# Patient Record
Sex: Female | Born: 1960 | Race: White | Hispanic: No | Marital: Single | Smoking: Never smoker
Health system: Southern US, Community
[De-identification: ages and names within clinical notes are randomized; demographics above are authoritative.]

## PROBLEM LIST (undated history)

## (undated) DIAGNOSIS — F32A Depression, unspecified: Secondary | ICD-10-CM

## (undated) DIAGNOSIS — Z9889 Other specified postprocedural states: Secondary | ICD-10-CM

## (undated) DIAGNOSIS — B019 Varicella without complication: Secondary | ICD-10-CM

## (undated) DIAGNOSIS — S8992XA Unspecified injury of left lower leg, initial encounter: Secondary | ICD-10-CM

## (undated) DIAGNOSIS — J342 Deviated nasal septum: Secondary | ICD-10-CM

## (undated) DIAGNOSIS — F439 Reaction to severe stress, unspecified: Secondary | ICD-10-CM

## (undated) DIAGNOSIS — B977 Papillomavirus as the cause of diseases classified elsewhere: Secondary | ICD-10-CM

## (undated) DIAGNOSIS — I209 Angina pectoris, unspecified: Secondary | ICD-10-CM

## (undated) DIAGNOSIS — F4321 Adjustment disorder with depressed mood: Secondary | ICD-10-CM

## (undated) DIAGNOSIS — F329 Major depressive disorder, single episode, unspecified: Secondary | ICD-10-CM

## (undated) HISTORY — PX: NOSE SURGERY: SHX723

## (undated) HISTORY — DX: Major depressive disorder, single episode, unspecified: F32.9

## (undated) HISTORY — DX: Papillomavirus as the cause of diseases classified elsewhere: B97.7

## (undated) HISTORY — DX: Varicella without complication: B01.9

## (undated) HISTORY — DX: Reaction to severe stress, unspecified: F43.9

## (undated) HISTORY — DX: Depression, unspecified: F32.A

## (undated) HISTORY — PX: OTHER SURGICAL HISTORY: SHX169

## (undated) HISTORY — PX: KNEE ARTHROSCOPY: SUR90

## (undated) HISTORY — DX: Deviated nasal septum: J34.2

## (undated) HISTORY — DX: Unspecified injury of left lower leg, initial encounter: S89.92XA

---

## 1982-09-15 HISTORY — PX: OTHER SURGICAL HISTORY: SHX169

## 2012-04-02 ENCOUNTER — Other Ambulatory Visit (HOSPITAL_COMMUNITY)
Admission: RE | Admit: 2012-04-02 | Discharge: 2012-04-02 | Disposition: A | Discharge status: Home / Self Care | Payer: BC Managed Care – PPO | Source: Ambulatory Visit | Attending: Family Medicine | Admitting: Family Medicine

## 2012-04-02 ENCOUNTER — Encounter: Payer: Self-pay | Admitting: Family Medicine

## 2012-04-02 ENCOUNTER — Ambulatory Visit (INDEPENDENT_AMBULATORY_CARE_PROVIDER_SITE_OTHER): Payer: BC Managed Care – PPO | Admitting: Family Medicine

## 2012-04-02 VITALS — BP 122/80 | HR 74 | Temp 98.3°F | Ht 63.0 in | Wt 155.8 lb

## 2012-04-02 DIAGNOSIS — Z01419 Encounter for gynecological examination (general) (routine) without abnormal findings: Secondary | ICD-10-CM | POA: Insufficient documentation

## 2012-04-02 DIAGNOSIS — J342 Deviated nasal septum: Secondary | ICD-10-CM

## 2012-04-02 DIAGNOSIS — Z124 Encounter for screening for malignant neoplasm of cervix: Secondary | ICD-10-CM | POA: Insufficient documentation

## 2012-04-02 LAB — BASIC METABOLIC PANEL
Calcium: 9.2 mg/dL (ref 8.4–10.5)
Creatinine, Ser: 0.7 mg/dL (ref 0.4–1.2)

## 2012-04-02 LAB — CBC WITH DIFFERENTIAL/PLATELET
Basophils Absolute: 0 10*3/uL (ref 0.0–0.1)
Eosinophils Absolute: 0.2 10*3/uL (ref 0.0–0.7)
HCT: 38.6 % (ref 36.0–46.0)
Hemoglobin: 12.8 g/dL (ref 12.0–15.0)
Lymphocytes Relative: 24.3 % (ref 12.0–46.0)
Lymphs Abs: 1.9 10*3/uL (ref 0.7–4.0)
MCHC: 33.1 g/dL (ref 30.0–36.0)
Neutro Abs: 4.8 10*3/uL (ref 1.4–7.7)
Platelets: 280 10*3/uL (ref 150.0–400.0)
RDW: 13.5 % (ref 11.5–14.6)

## 2012-04-02 LAB — LIPID PANEL
Cholesterol: 162 mg/dL (ref 0–200)
HDL: 61 mg/dL (ref >39.00)
LDL Cholesterol: 81 mg/dL (ref 0–99)
Triglycerides: 102 mg/dL (ref 0.0–149.0)
VLDL: 20.4 mg/dL (ref 0.0–40.0)

## 2012-04-02 LAB — HEPATIC FUNCTION PANEL
Bilirubin, Direct: 0 mg/dL (ref 0.0–0.3)
Total Bilirubin: 0.3 mg/dL (ref 0.3–1.2)

## 2012-04-02 NOTE — Progress Notes (Signed)
  Subjective:    Patient ID: Heather Hatfield, female    DOB: 19-Dec-1960, 51 y.o.   MRN: [id number]  HPI New to establish.  Previous MD in [place].  Desires CPE.  Due for colonoscopy after Jan, due for mammo and pap.  Nasal congestion- sxs started in Feb.  Hx of deviated septum w/ assymetric nostrils.  Finds herself pulling on her face to try and open her nasal passageways.  Not currently on any allergy meds.   Review of Systems Patient reports no vision/hearing changes, adenopathy, fever, weight change,  persistant/recurrent hoarseness , swallowing issues, chest pain, palpitations, edema, persistant/recurrent cough, hemoptysis, dyspnea (rest/exertional/paroxysmal nocturnal), gastrointestinal bleeding (melena, rectal bleeding), abdominal pain, significant heartburn, bowel changes, GU symptoms (dysuria, hematuria, incontinence), Gyn symptoms (abnormal  bleeding, pain),  syncope, focal weakness, memory loss, numbness & tingling, skin/hair/nail changes, abnormal bruising or bleeding, anxiety, or depression.   Fear of flying- takes Ativan prn.    Objective:   Physical Exam  General Appearance:    Alert, cooperative, no distress, appears stated age  Head:    Normocephalic, without obvious abnormality, atraumatic  Eyes:    PERRL, conjunctiva/corneas clear, EOM's intact, fundi    benign, both eyes  Ears:    Normal TM's and external ear canals, both ears  Nose:   Nares normal, septum deviated to R, mucosa edema, no drainage or sinus tenderness  Throat:   Lips, mucosa, and tongue normal; teeth and gums normal  Neck:   Supple, symmetrical, trachea midline, no adenopathy;    Thyroid: no enlargement/tenderness/nodules  Back:     Symmetric, no curvature, ROM normal, no CVA tenderness  Lungs:     Clear to auscultation bilaterally, respirations unlabored  Chest Wall:    No tenderness or deformity   Heart:    Regular rate and rhythm, S1 and S2 normal, no murmur, rub   or gallop  Breast Exam:    No  tenderness, masses, or nipple abnormality  Abdomen:     Soft, non-tender, bowel sounds active all four quadrants,    no masses, no organomegaly  Genitalia:    External genitalia normal, cervix normal in appearance, no CMT, uterus in normal size and position, adnexa w/out mass or tenderness, mucosa pink and moist, no lesions or discharge present  Rectal:    Normal external appearance  Extremities:   Extremities normal, atraumatic, no cyanosis or edema  Pulses:   2+ and symmetric all extremities  Skin:   Skin color, texture, turgor normal, no rashes or lesions  Lymph nodes:   Cervical, supraclavicular, and axillary nodes normal  Neurologic:   CNII-XII intact, normal strength, sensation and reflexes    throughout          Assessment & Plan:

## 2012-04-02 NOTE — Patient Instructions (Addendum)
We'll notify you of your lab results and make any changes if needed You look great!  Keep up the good work! We'll call you with your mammo appt Call with any questions or concerns Think of [place] as your home base Welcome!  We're glad to have you!

## 2012-04-02 NOTE — Assessment & Plan Note (Signed)
Pap collected. 

## 2012-04-02 NOTE — Assessment & Plan Note (Signed)
Pt's PE WNL w/ exception of deviated nasal septum.  Refer to ENT for complete evaluation.  Check labs.  EKG done- see document for interpretation.  Refer for mammo.  Anticipatory guidance provided.

## 2012-04-05 ENCOUNTER — Encounter: Payer: Self-pay | Admitting: *Deleted

## 2012-04-07 ENCOUNTER — Encounter: Payer: Self-pay | Admitting: *Deleted

## 2012-04-07 LAB — VITAMIN D 1,25 DIHYDROXY: Vitamin D2 1, 25 (OH)2: <8 pg/mL

## 2012-04-08 ENCOUNTER — Encounter: Payer: Self-pay | Admitting: *Deleted

## 2012-04-09 ENCOUNTER — Ambulatory Visit (HOSPITAL_BASED_OUTPATIENT_CLINIC_OR_DEPARTMENT_OTHER)
Admission: RE | Admit: 2012-04-09 | Discharge: 2012-04-09 | Disposition: A | Discharge status: Home / Self Care | Payer: BC Managed Care – PPO | Source: Ambulatory Visit | Attending: Family Medicine | Admitting: Family Medicine

## 2012-04-09 DIAGNOSIS — Z01419 Encounter for gynecological examination (general) (routine) without abnormal findings: Secondary | ICD-10-CM

## 2012-04-09 DIAGNOSIS — Z1231 Encounter for screening mammogram for malignant neoplasm of breast: Secondary | ICD-10-CM | POA: Insufficient documentation

## 2012-04-09 IMAGING — MG MM DIGITAL SCREENING BILATERAL
4 series · 4 of 4 positions shown · non-contrast
Comparison: Previous exams

CLINICAL DATA: Screening.

DIGITAL SCREENING MAMMOGRAM WITH CAD

[R CC]
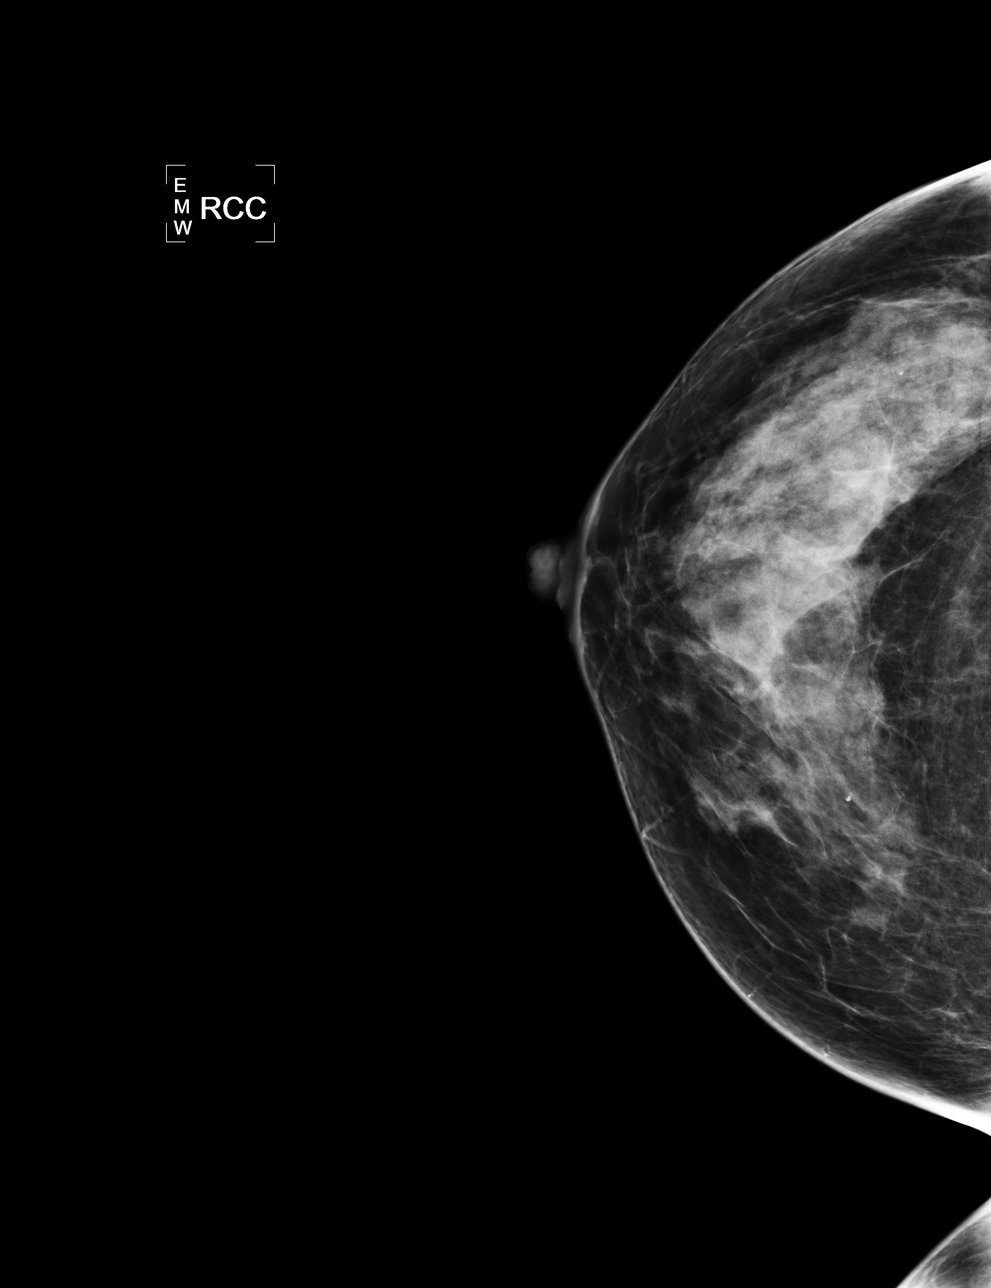

[L CC]
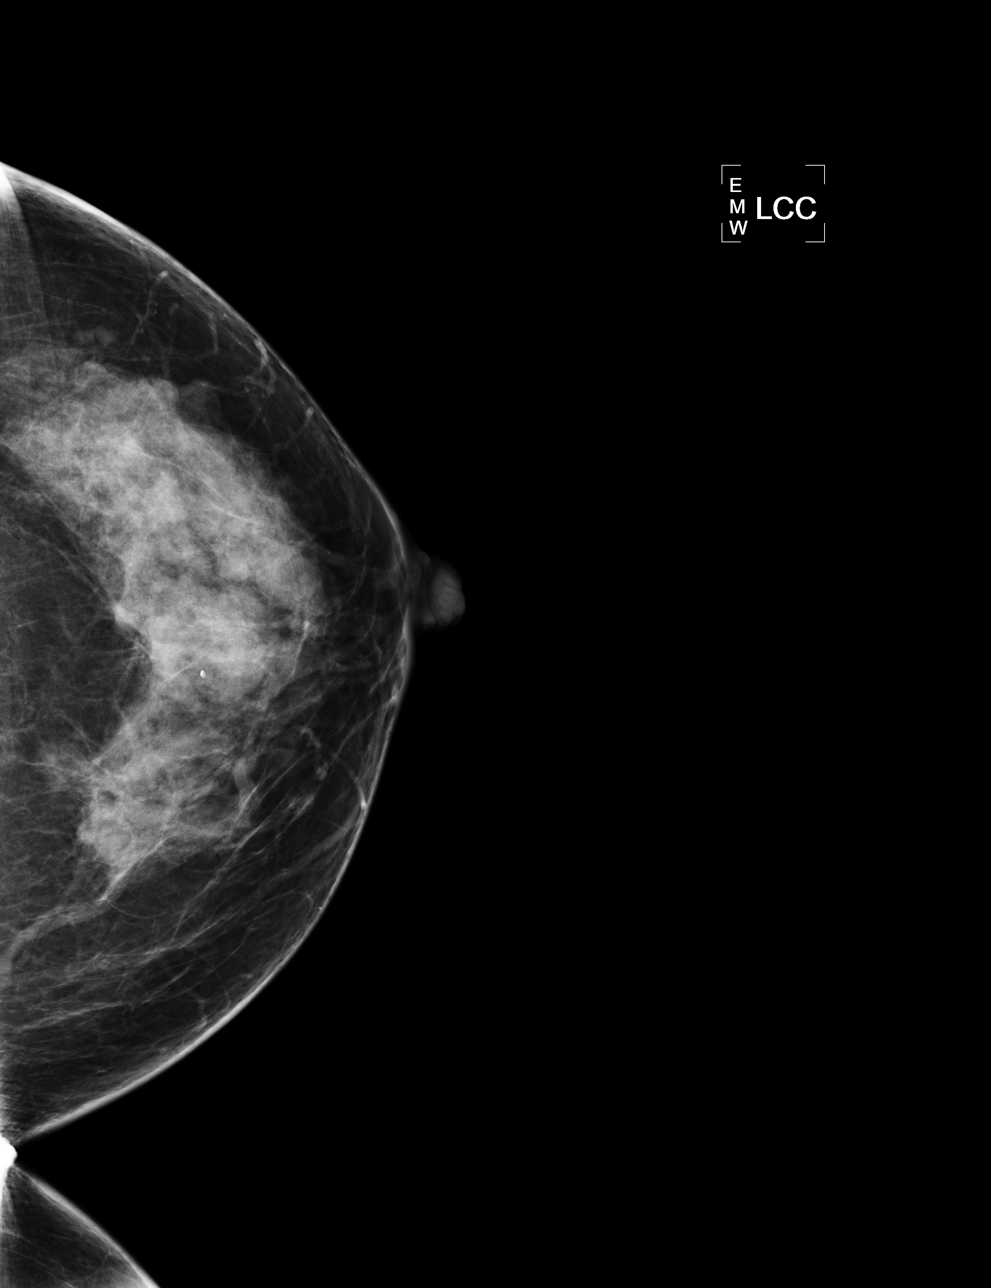

[L MLO]
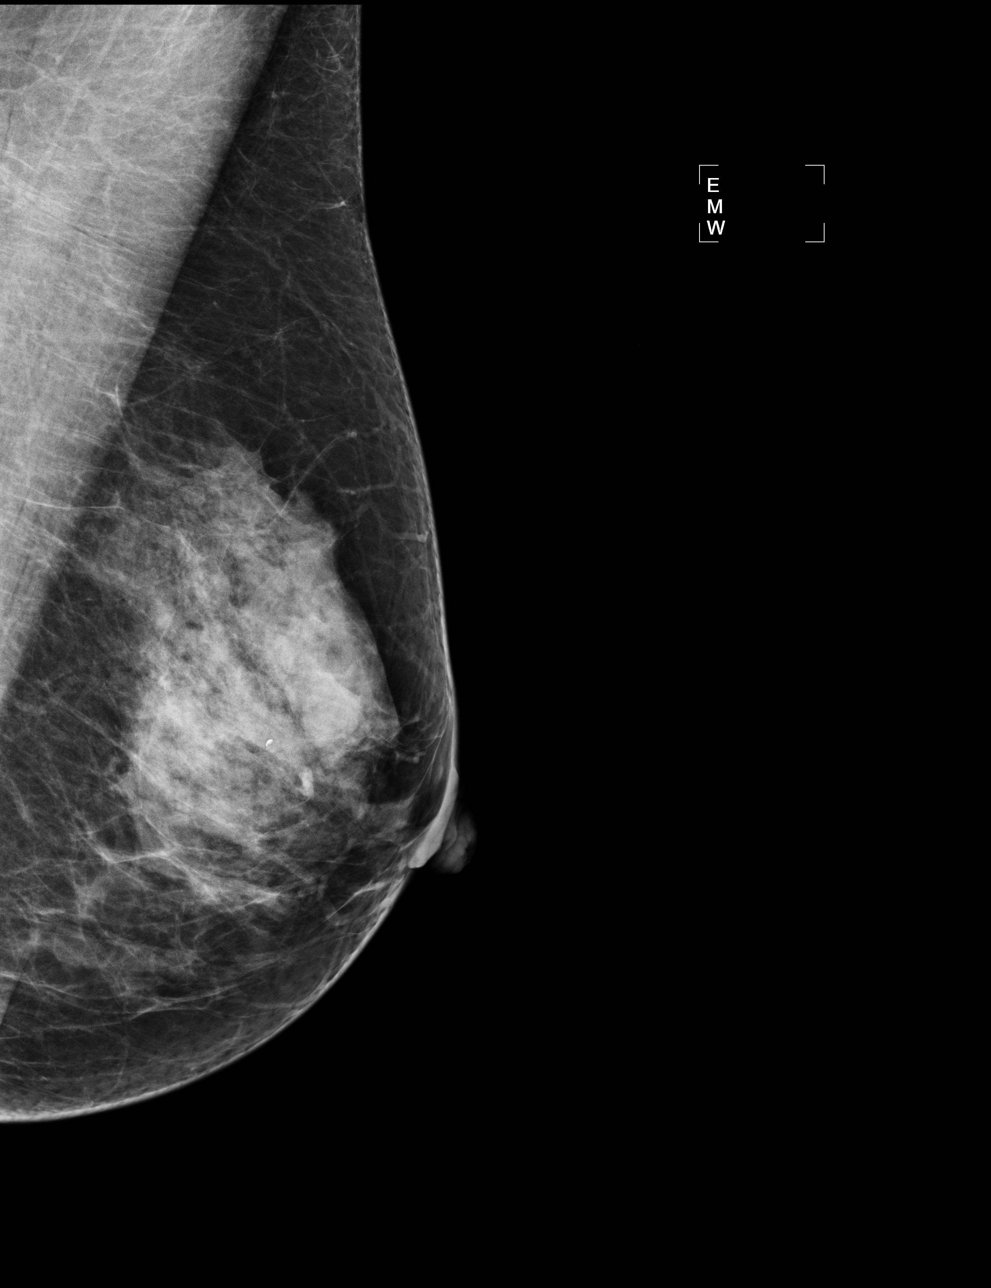

[R MLO]
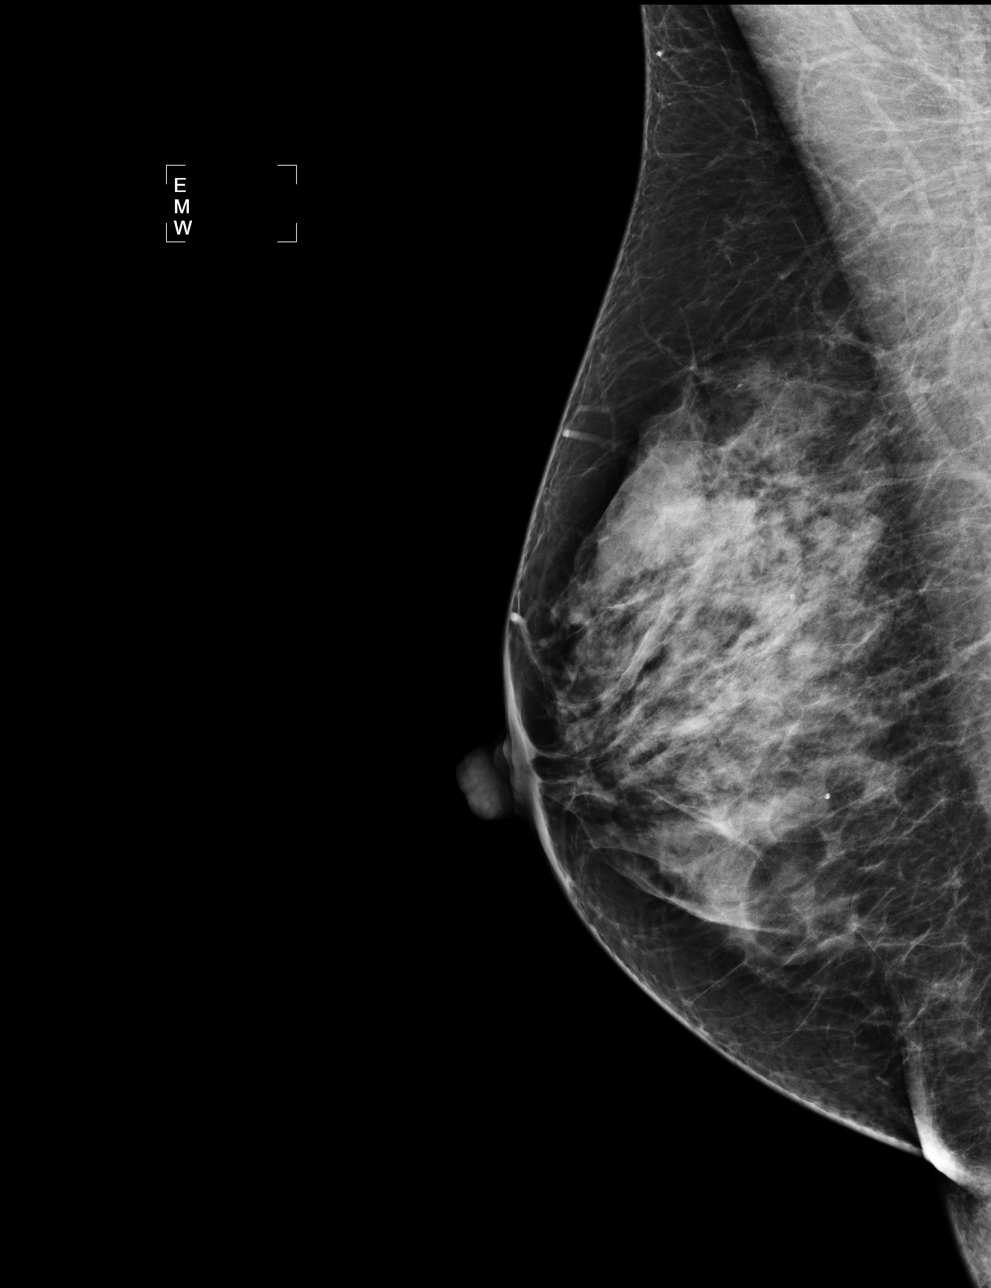

[4 of 4 positions shown; findings below may reference images not displayed]

FINDINGS: The breast tissue is heterogeneously dense. No new
masses, architectural distortion, or calcifications are present.

Images were processed with CAD.
IMPRESSION: No mammographic evidence of malignancy.

A result letter of this screening mammogram will be mailed directly
to the patient.

RECOMMENDATION:
Screening mammogram in one year. (Code:[94])

BI-RADS CATEGORY 1:  Negative

## 2012-08-10 ENCOUNTER — Ambulatory Visit (INDEPENDENT_AMBULATORY_CARE_PROVIDER_SITE_OTHER): Payer: BC Managed Care – PPO | Admitting: Internal Medicine

## 2012-08-10 ENCOUNTER — Encounter: Payer: Self-pay | Admitting: Internal Medicine

## 2012-08-10 VITALS — BP 136/84 | HR 79 | Temp 97.9°F | Resp 18 | Ht 63.0 in | Wt 156.0 lb

## 2012-08-10 DIAGNOSIS — F32A Depression, unspecified: Secondary | ICD-10-CM

## 2012-08-10 DIAGNOSIS — F329 Major depressive disorder, single episode, unspecified: Secondary | ICD-10-CM

## 2012-08-10 DIAGNOSIS — J342 Deviated nasal septum: Secondary | ICD-10-CM

## 2012-08-10 DIAGNOSIS — S99929A Unspecified injury of unspecified foot, initial encounter: Secondary | ICD-10-CM

## 2012-08-10 DIAGNOSIS — S8990XA Unspecified injury of unspecified lower leg, initial encounter: Secondary | ICD-10-CM

## 2012-08-10 DIAGNOSIS — F3289 Other specified depressive episodes: Secondary | ICD-10-CM

## 2012-08-10 DIAGNOSIS — S8992XA Unspecified injury of left lower leg, initial encounter: Secondary | ICD-10-CM

## 2012-08-10 NOTE — Patient Instructions (Addendum)
For skin surveillance exam  [facility]  [phone]  Dr. Emily Filbert Dr. Sharyn Lull  Dr. Danella Deis  BMI today  27.63  Blood pressure  136/84  Return as needed

## 2012-08-10 NOTE — Progress Notes (Signed)
  Subjective:    Patient ID: Heather Hatfield, female    DOB: 03/09/1961, 51 y.o.   MRN: [id number]  HPI  New pt here for first visit.  She works at [employer].   PMH of depression, deviated nasal septum and remote L knee injury that is managed at [facility].   Overall doing well but would like to speak to someone about weight loss and she needs a colonoscopy.  She has also reinjured her L knee but will be seeing [place] [facility] for this  She would also like to have a dermatologist for skin surveillance  Recent labs Dr. Beverely Low are normal  No Known Allergies Past Medical History  Diagnosis Date  . Depression     situational  . Chicken pox    Past Surgical History  Procedure Date  . Dnc 1984   History   Social History  . Marital Status: Single    Spouse Name: N/A    Number of Children: N/A  . Years of Education: N/A   Occupational History  . Not on file.   Social History Main Topics  . Smoking status: Never Smoker   . Smokeless tobacco: Not on file  . Alcohol Use: 0.0 oz/week     Comment: daily wine  . Drug Use: No  . Sexually Active: Yes    Birth Control/ Protection: Surgical   Other Topics Concern  . Not on file   Social History Narrative  . No narrative on file   Family History  Problem Relation Age of Onset  . Hyperlipidemia Mother   . Hyperlipidemia Father   . Stroke Father   . Hypertension Father   . Arthritis Maternal Grandmother   . Cancer Maternal Grandmother    Patient Active Problem List  Diagnosis  . Routine gynecological examination  . Screening for malignant neoplasm of the cervix   No current outpatient prescriptions on file prior to visit.     Review of Systems See HPI    Objective:   Physical Exam Physical Exam  Nursing note and vitals reviewed.  Constitutional: She is oriented to person, place, and time. She appears well-developed and well-nourished.  HENT:  Head: Normocephalic and atraumatic.  Cardiovascular: Normal rate and  regular rhythm. Exam reveals no gallop and no friction rub.  No murmur heard.  Pulmonary/Chest: Breath sounds normal. She has no wheezes. She has no rales.  Neurological: She is alert and oriented to person, place, and time.  Skin: Skin is warm and dry.  Psychiatric: She has a normal mood and affect. Her behavior is normal.              Assessment & Plan:  Overweight:  Will refer to Dr. Kinnie Scales for wt loss and screening colonoscopy  Depression on no meds now  History of deviated septum  L knee injury  Skin surveillance:  I gave pt number to Dr. Dellia Nims practice so she can call for appt

## 2012-12-06 ENCOUNTER — Encounter: Payer: Self-pay | Admitting: *Deleted

## 2013-07-14 ENCOUNTER — Ambulatory Visit (INDEPENDENT_AMBULATORY_CARE_PROVIDER_SITE_OTHER): Payer: BC Managed Care – PPO | Admitting: Internal Medicine

## 2013-07-14 ENCOUNTER — Encounter: Payer: Self-pay | Admitting: Internal Medicine

## 2013-07-14 VITALS — BP 119/73 | HR 70 | Temp 98.0°F | Resp 16 | Wt 151.0 lb

## 2013-07-14 DIAGNOSIS — F419 Anxiety disorder, unspecified: Secondary | ICD-10-CM

## 2013-07-14 DIAGNOSIS — Z733 Stress, not elsewhere classified: Secondary | ICD-10-CM

## 2013-07-14 DIAGNOSIS — F411 Generalized anxiety disorder: Secondary | ICD-10-CM

## 2013-07-14 DIAGNOSIS — F439 Reaction to severe stress, unspecified: Secondary | ICD-10-CM

## 2013-07-14 NOTE — Patient Instructions (Signed)
Quentin Mulling Cristela Felt:  [phone]  Vonna Kotyk  :  [phone]

## 2013-07-14 NOTE — Progress Notes (Signed)
  Subjective:    Patient ID: Heather Hatfield, female    DOB: [birth year], 52 y.o.   MRN: [id number]  HPI  Heather Hatfield is here for acute visit.    Lots of situational stress.  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  Long distance relationship  Inability to trust  As first husband left her after an affair  She currently gets very irritable and anxious.    Some sadness but not daily.  Had ativan and low dose prozac in past but does not want any meds at this point.  She would like names of a therapist.    No daily depression  No S/H ideation  No Known Allergies Past Medical History  Diagnosis Date  . Depression     situational  . Chicken pox    Past Surgical History  Procedure Laterality Date  . Dnc  1984  . Knee arthroscopy    . Nose surgery     History   Social History  . Marital Status: Single    Spouse Name: N/A    Number of Children: N/A  . Years of Education: N/A   Occupational History  . Not on file.   Social History Main Topics  . Smoking status: Never Smoker   . Smokeless tobacco: Not on file  . Alcohol Use: 0.0 oz/week     Comment: daily wine  . Drug Use: No  . Sexual Activity: Yes    Birth Control/ Protection: Surgical   Other Topics Concern  . Not on file   Social History Narrative  . No narrative on file   Family History  Problem Relation Age of Onset  . Hyperlipidemia Mother   . Hyperlipidemia Father   . Stroke Father   . Hypertension Father   . Arthritis Maternal Grandmother   . Cancer Maternal Grandmother    Patient Active Problem List   Diagnosis Date Noted  . Situational stress 07/14/2013  . Depression 08/10/2012  . Deviated nasal septum 08/10/2012  . Left knee injury 08/10/2012  . Routine gynecological examination 04/02/2012  . Screening for malignant neoplasm of the cervix 04/02/2012   No current outpatient prescriptions on file prior to visit.   No current facility-administered medications on file prior to visit.      Review of  Systems See HPI     Objective:   Physical Exam Physical Exam  Nursing note and vitals reviewed.  Constitutional: She is oriented to person, place, and time. She appears well-developed and well-nourished.  HENT:  Head: Normocephalic and atraumatic.  Cardiovascular: Normal rate and regular rhythm. Exam reveals no gallop and no friction rub.  No murmur heard.  Pulmonary/Chest: Breath sounds normal. She has no wheezes. She has no rales.  Neurological: She is alert and oriented to person, place, and time.  Skin: Skin is warm and dry.  Psychiatric: She has a normal mood and affect. Her behavior is normal.        Assessment & Plan:  Situational stress:  I gave number to two therapists.  Harriett Sine guttman and Cate wineburg  Pt to call  She does not wish meds  She is to call if any worsening

## 2013-10-19 ENCOUNTER — Ambulatory Visit (INDEPENDENT_AMBULATORY_CARE_PROVIDER_SITE_OTHER): Payer: BC Managed Care – PPO | Admitting: Internal Medicine

## 2013-10-19 ENCOUNTER — Encounter: Payer: Self-pay | Admitting: Internal Medicine

## 2013-10-19 VITALS — BP 136/87 | HR 90 | Temp 98.5°F | Resp 22 | Wt 158.0 lb

## 2013-10-19 DIAGNOSIS — R059 Cough, unspecified: Secondary | ICD-10-CM

## 2013-10-19 DIAGNOSIS — R05 Cough: Secondary | ICD-10-CM

## 2013-10-19 DIAGNOSIS — J029 Acute pharyngitis, unspecified: Secondary | ICD-10-CM

## 2013-10-19 DIAGNOSIS — J209 Acute bronchitis, unspecified: Secondary | ICD-10-CM

## 2013-10-19 MED ORDER — HYDROCOD POLST-CHLORPHEN POLST 10-8 MG/5ML PO LQCR: 5.0000 mL | Freq: Two times a day (BID) | ORAL | Status: DC | PRN

## 2013-10-19 MED ORDER — AZITHROMYCIN 250 MG PO TABS: ORAL_TABLET | ORAL | Status: DC

## 2013-10-19 MED ORDER — METHYLPREDNISOLONE ACETATE 80 MG/ML IJ SUSP
80.0000 mg | Freq: Once | INTRAMUSCULAR | Status: AC
  Administered 2013-10-19: 80 mg via INTRAMUSCULAR

## 2013-10-19 NOTE — Progress Notes (Signed)
   Subjective:    Patient ID: Heather Hatfield, female    DOB: 06/06/1961, 53 y.o.   MRN: [id number]  HPI [name] is here with acute visit.   5 days productive cough yellow mucous.  No chest pain, no fever,  No SOB  She is a nonsmoker  Began with severe sore throat  No Known Allergies Past Medical History  Diagnosis Date  . Depression     situational  . Chicken pox    Past Surgical History  Procedure Laterality Date  . Dnc  1984  . Knee arthroscopy    . Nose surgery     History   Social History  . Marital Status: Single    Spouse Name: N/A    Number of Children: N/A  . Years of Education: N/A   Occupational History  . Not on file.   Social History Main Topics  . Smoking status: Never Smoker   . Smokeless tobacco: Not on file  . Alcohol Use: 0.0 oz/week     Comment: daily wine  . Drug Use: No  . Sexual Activity: Yes    Birth Control/ Protection: Surgical   Other Topics Concern  . Not on file   Social History Narrative  . No narrative on file   Family History  Problem Relation Age of Onset  . Hyperlipidemia Mother   . Hyperlipidemia Father   . Stroke Father   . Hypertension Father   . Arthritis Maternal Grandmother   . Cancer Maternal Grandmother    Patient Active Problem List   Diagnosis Date Noted  . Situational stress 07/14/2013  . Depression 08/10/2012  . Deviated nasal septum 08/10/2012  . Left knee injury 08/10/2012  . Routine gynecological examination 04/02/2012  . Screening for malignant neoplasm of the cervix 04/02/2012   No current outpatient prescriptions on file prior to visit.   No current facility-administered medications on file prior to visit.       Review of Systems    see HPI. Objective:   Physical Exam  Physical Exam  Constitutional: She is oriented to person, place, and time. She appears well-developed and well-nourished. She is cooperative.  HENT:  Head: Normocephalic and atraumatic.  Right Ear: A middle ear effusion is  present.  Left Ear: A middle ear effusion is present.  Nose: Mucosal edema present.  Mouth/Throat: Oropharyngeal exudate and posterior oropharyngeal erythema present.  Serous effusion bilaterally  Eyes: Conjunctivae and EOM are normal. Pupils are equal, round, and reactive to light.  Neck: Neck supple. Carotid bruit is not present. No mass present.  Cardiovascular: Regular rhythm, normal heart sounds, intact distal pulses and normal pulses. Exam reveals no gallop and no friction rub.  No murmur heard.  Pulmonary/Chest: Breath sounds normal. She has no wheezes. She has no rhonchi. She has no rales.  Lymphadenopathy:  She has cervical adenopathy.  Neurological: She is alert and oriented to person, place, and time.  Skin: Skin is warm and dry. No abrasion, no bruising, no ecchymosis and no rash noted. No cyanosis. Nails show no clubbing.  Psychiatric: She has a normal mood and affect. Her speech is normal and behavior is normal.           Assessment & Plan:  Pharnygitis  Z-pak  Pulse ox 97%  Cough tussionex  1 tsp q12h prn.  Prednisone 80 mg Im in office  See me if not betterd

## 2013-10-19 NOTE — Patient Instructions (Signed)
See me if not better 

## 2013-11-10 ENCOUNTER — Encounter: Payer: BC Managed Care – PPO | Admitting: Internal Medicine

## 2013-11-24 ENCOUNTER — Ambulatory Visit (INDEPENDENT_AMBULATORY_CARE_PROVIDER_SITE_OTHER): Payer: BC Managed Care – PPO | Admitting: Internal Medicine

## 2013-11-24 ENCOUNTER — Encounter: Payer: Self-pay | Admitting: Internal Medicine

## 2013-11-24 VITALS — BP 133/83 | HR 69 | Temp 98.0°F | Resp 16 | Wt 149.0 lb

## 2013-11-24 DIAGNOSIS — Z733 Stress, not elsewhere classified: Secondary | ICD-10-CM

## 2013-11-24 DIAGNOSIS — Z01419 Encounter for gynecological examination (general) (routine) without abnormal findings: Secondary | ICD-10-CM

## 2013-11-24 DIAGNOSIS — F439 Reaction to severe stress, unspecified: Secondary | ICD-10-CM

## 2013-11-24 DIAGNOSIS — Z124 Encounter for screening for malignant neoplasm of cervix: Secondary | ICD-10-CM

## 2013-11-24 DIAGNOSIS — R0789 Other chest pain: Secondary | ICD-10-CM

## 2013-11-24 DIAGNOSIS — Z0000 Encounter for general adult medical examination without abnormal findings: Secondary | ICD-10-CM

## 2013-11-24 DIAGNOSIS — Z1151 Encounter for screening for human papillomavirus (HPV): Secondary | ICD-10-CM

## 2013-11-24 LAB — COMPREHENSIVE METABOLIC PANEL
ALBUMIN: 4.6 g/dL (ref 3.5–5.2)
ALK PHOS: 60 U/L (ref 39–117)
ALT: 15 U/L (ref 0–35)
AST: 16 U/L (ref 0–37)
BILIRUBIN TOTAL: 0.4 mg/dL (ref 0.2–1.2)
BUN: 13 mg/dL (ref 6–23)
CO2: 27 mEq/L (ref 19–32)
CREATININE: 0.65 mg/dL (ref 0.50–1.10)
Calcium: 9.4 mg/dL (ref 8.4–10.5)
Chloride: 102 mEq/L (ref 96–112)
GLUCOSE: 74 mg/dL (ref 70–99)
Potassium: 4.1 mEq/L (ref 3.5–5.3)
Sodium: 139 mEq/L (ref 135–145)
Total Protein: 7.2 g/dL (ref 6.0–8.3)

## 2013-11-24 LAB — HEMOCCULT GUIAC POC 1CARD (OFFICE): Fecal Occult Blood, POC: NEGATIVE

## 2013-11-24 LAB — CBC WITH DIFFERENTIAL/PLATELET
BASOS PCT: 1 % (ref 0–1)
Basophils Absolute: 0.1 10*3/uL (ref 0.0–0.1)
EOS ABS: 0.1 10*3/uL (ref 0.0–0.7)
EOS PCT: 2 % (ref 0–5)
HEMATOCRIT: 39.4 % (ref 36.0–46.0)
HEMOGLOBIN: 13.3 g/dL (ref 12.0–15.0)
Lymphocytes Relative: 32 % (ref 12–46)
Lymphs Abs: 2.3 10*3/uL (ref 0.7–4.0)
MCH: 29.6 pg (ref 26.0–34.0)
MCHC: 33.8 g/dL (ref 30.0–36.0)
MCV: 87.8 fL (ref 78.0–100.0)
MONO ABS: 0.6 10*3/uL (ref 0.1–1.0)
MONOS PCT: 8 % (ref 3–12)
Neutro Abs: 4 10*3/uL (ref 1.7–7.7)
Neutrophils Relative %: 57 % (ref 43–77)
Platelets: 358 10*3/uL (ref 150–400)
RBC: 4.49 MIL/uL (ref 3.87–5.11)
RDW: 13.8 % (ref 11.5–15.5)
WBC: 7.1 10*3/uL (ref 4.0–10.5)

## 2013-11-24 LAB — POCT URINALYSIS DIPSTICK
Bilirubin, UA: NEGATIVE
GLUCOSE UA: NEGATIVE
Ketones, UA: NEGATIVE
Leukocytes, UA: NEGATIVE
NITRITE UA: NEGATIVE
Protein, UA: NEGATIVE
RBC UA: NEGATIVE
Spec Grav, UA: 1.015
UROBILINOGEN UA: NEGATIVE
pH, UA: 6.5

## 2013-11-24 LAB — LIPID PANEL
CHOL/HDL RATIO: 3 ratio
Cholesterol: 178 mg/dL (ref 0–200)
HDL: 59 mg/dL (ref >39)
LDL CALC: 97 mg/dL (ref 0–99)
TRIGLYCERIDES: 109 mg/dL (ref <150)
VLDL: 22 mg/dL (ref 0–40)

## 2013-11-24 NOTE — Patient Instructions (Addendum)
Give phone number to breast center for her to reschedule 3D screening mammogram  Will refer to cardiology   Dr. Stanford Breed  At our center or [facility]  (pt preference)  See me in 3-4 months  Get Chillow pillow for night sweats

## 2013-11-24 NOTE — Progress Notes (Signed)
Subjective:    Patient ID: Heather Hatfield, female    DOB: 22-Dec-1960, 53 y.o.   MRN: [id number]  HPI  Heather Hatfield is here for CPE.  She is concerned about midsternal chest pressure over last few months   .  It is non-exertional in nature , lasts "about 20 mins".  No radiation,  No SOB,  No n/v or diaphoresis.  FH pos for father with MI  .  She is a non-smoker.  Lipids normal.  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     HM  She need pap and mm.  MGM had breast cancer  No Known Allergies Past Medical History  Diagnosis Date  . Depression     situational  . Chicken pox    Past Surgical History  Procedure Laterality Date  . Dnc  1984  . Knee arthroscopy    . Nose surgery     History   Social History  . Marital Status: Single    Spouse Name: N/A    Number of Children: N/A  . Years of Education: N/A   Occupational History  . Not on file.   Social History Main Topics  . Smoking status: Never Smoker   . Smokeless tobacco: Not on file  . Alcohol Use: 0.0 oz/week     Comment: daily wine  . Drug Use: No  . Sexual Activity: Yes    Birth Control/ Protection: Surgical   Other Topics Concern  . Not on file   Social History Narrative  . No narrative on file   Family History  Problem Relation Age of Onset  . Hyperlipidemia Mother   . Hyperlipidemia Father   . Stroke Father   . Hypertension Father   . Arthritis Maternal Grandmother   . Cancer Maternal Grandmother    Patient Active Problem List   Diagnosis Date Noted  . Situational stress 07/14/2013  . Depression 08/10/2012  . Deviated nasal septum 08/10/2012  . Left knee injury 08/10/2012  . Routine gynecological examination 04/02/2012  . Screening for malignant neoplasm of the cervix 04/02/2012   No current outpatient prescriptions on file prior to visit.   No current facility-administered medications on file prior to visit.      Review of Systems     Objective:   Physical Exam Physical Exam  Vital signs and nursing note reviewed  Constitutional: She is oriented to person, place, and time. She appears well-developed and well-nourished. She is cooperative.  HENT:  Head: Normocephalic and atraumatic.  Right Ear: Tympanic membrane normal.  Left Ear: Tympanic membrane normal.  Nose: Nose normal.  Mouth/Throat: Oropharynx is clear and moist and mucous membranes are normal. No oropharyngeal exudate or posterior oropharyngeal erythema.  Eyes: Conjunctivae and EOM are normal. Pupils are equal, round, and reactive to light.  Neck: Neck supple. No JVD present. Carotid bruit is not present. No mass and no thyromegaly present.  Cardiovascular: Regular rhythm, normal heart sounds, intact distal pulses and normal pulses.  Exam reveals no gallop and no friction rub.   No murmur heard. Pulses:      Dorsalis pedis pulses are 2+ on the right side, and 2+ on the left side.  Pulmonary/Chest: Breath sounds normal. She has no wheezes. She has no rhonchi. She has no rales. Right breast exhibits no mass, no nipple discharge and no skin change. Left breast exhibits no mass, no nipple discharge and no skin change.  Abdominal:  Soft. Bowel sounds are normal. She exhibits no distension and no mass. There is no hepatosplenomegaly. There is no tenderness. There is no CVA tenderness.   Rectal no mass guaiac neg Genitourinary: Rectum normal, vagina normal and uterus normal. Rectal exam shows no mass. Guaiac negative stool. No labial fusion. There is no lesion on the right labia. There is no lesion on the left labia. Cervix exhibits no motion tenderness. Right adnexum displays no mass, no tenderness and no fullness. Left adnexum displays no mass, no tenderness and no fullness. No erythema around the vagina.  Musculoskeletal:       No active synovitis to any joint.    Lymphadenopathy:       Right cervical: No superficial cervical adenopathy present.      Left cervical: No superficial  cervical adenopathy present.       Right axillary: No pectoral and no lateral adenopathy present.       Left axillary: No pectoral and no lateral adenopathy present.      Right: No inguinal adenopathy present.       Left: No inguinal adenopathy present.  Neurological: She is alert and oriented to person, place, and time. She has normal strength and normal reflexes. No cranial nerve deficit or sensory deficit. She displays a negative Romberg sign. Coordination and gait normal.  Skin: Skin is warm and dry. No abrasion, no bruising, no ecchymosis and no rash noted. No cyanosis. Nails show no clubbing.  Psychiatric: She has a normal mood and affect. Her speech is normal and behavior is normal.          Assessment & Plan:  Health Maintenance:  Pt wishes to schedule her 3D mm herself.  Pap today  Chest pressure  Discussed that her pain has many  atypical features but risk factors  FH in father.  Non-specific ST depression inferrior leads on EKG. She wishes to have further diagnostic testing and I am not opposed to this.  Will set up with [facility] cardiology.  OK  To take baby ASA  Situational stress  She was given names to two therapists         Assessment & Plan:

## 2013-11-25 LAB — VITAMIN D 25 HYDROXY (VIT D DEFICIENCY, FRACTURES): Vit D, 25-Hydroxy: 41 ng/mL (ref 30–89)

## 2013-11-25 LAB — TSH: TSH: 2.082 u[IU]/mL (ref 0.350–4.500)

## 2013-11-28 ENCOUNTER — Encounter: Payer: Self-pay | Admitting: *Deleted

## 2013-12-02 ENCOUNTER — Other Ambulatory Visit: Payer: Self-pay

## 2013-12-02 DIAGNOSIS — Z1231 Encounter for screening mammogram for malignant neoplasm of breast: Secondary | ICD-10-CM

## 2013-12-13 ENCOUNTER — Telehealth: Payer: Self-pay | Admitting: Internal Medicine

## 2013-12-13 DIAGNOSIS — B977 Papillomavirus as the cause of diseases classified elsewhere: Secondary | ICD-10-CM

## 2013-12-13 NOTE — Telephone Encounter (Signed)
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

## 2013-12-23 ENCOUNTER — Ambulatory Visit
Admission: RE | Admit: 2013-12-23 | Discharge: 2013-12-23 | Disposition: A | Discharge status: Home / Self Care | Payer: BC Managed Care – PPO | Source: Ambulatory Visit

## 2013-12-23 DIAGNOSIS — Z1231 Encounter for screening mammogram for malignant neoplasm of breast: Secondary | ICD-10-CM

## 2013-12-23 IMAGING — MG MM SCREENING BREAST TOMO BILATERAL
9 of 12 series · 9 of 28 positions shown · non-contrast
Comparison: Previous exam(s).

CLINICAL DATA: Screening.

EXAM:
DIGITAL SCREENING BILATERAL MAMMOGRAM WITH 3D TOMO WITH CAD

[R MLO synth-2D]
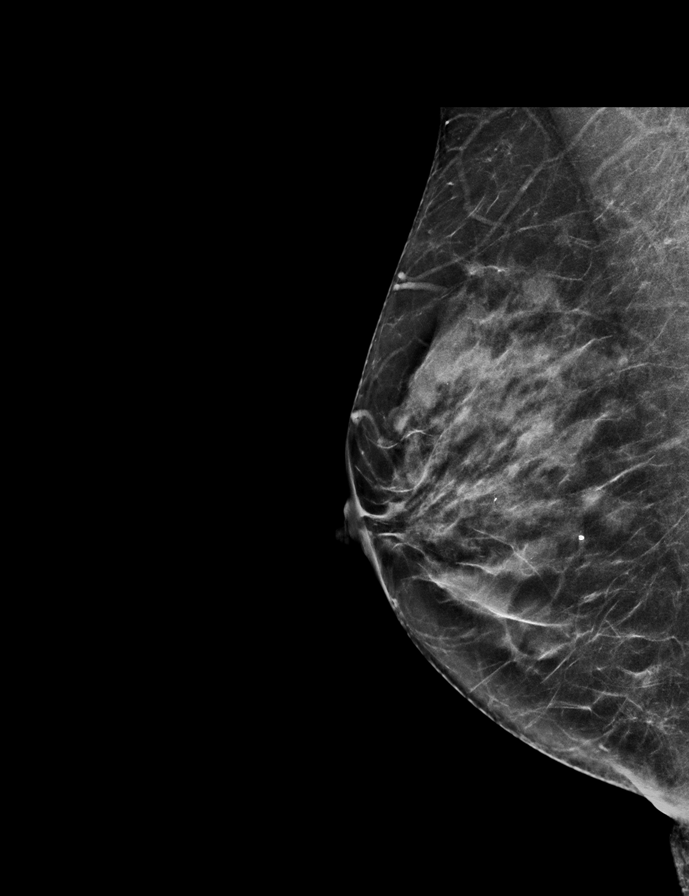

[L MLO synth-2D]
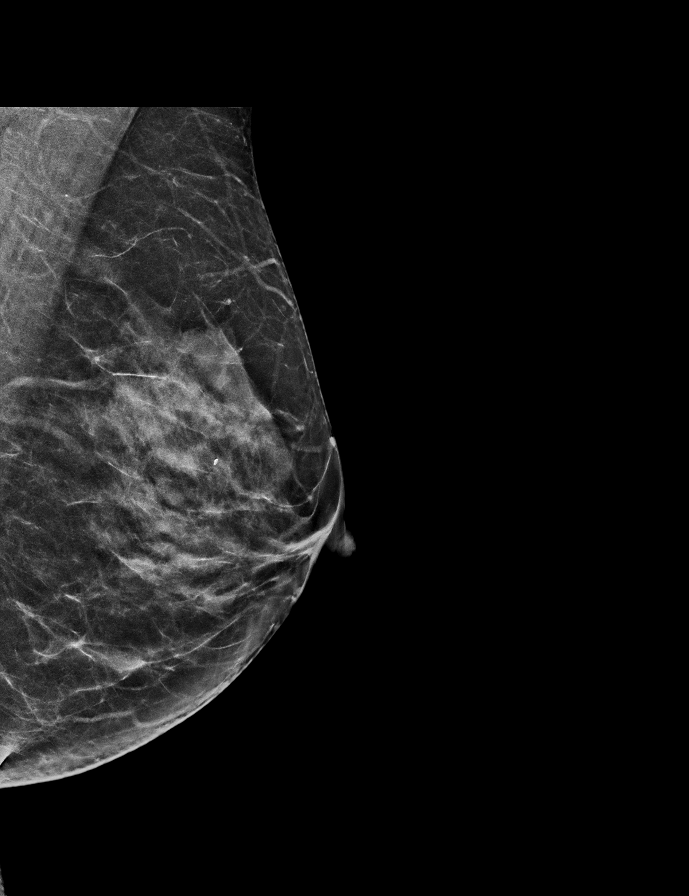

[R CC synth-2D]
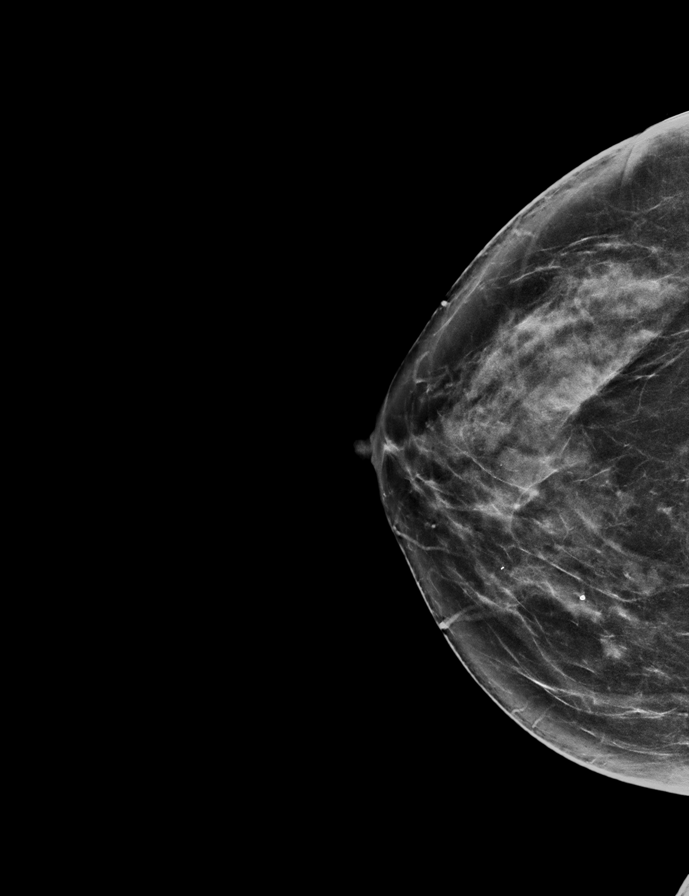

[L CC synth-2D]
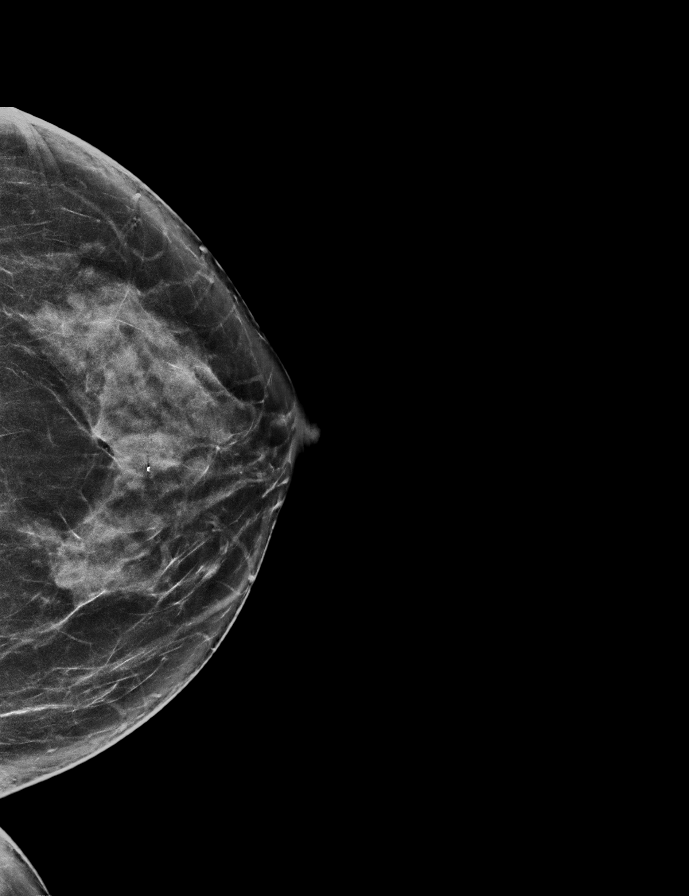

[R CC]
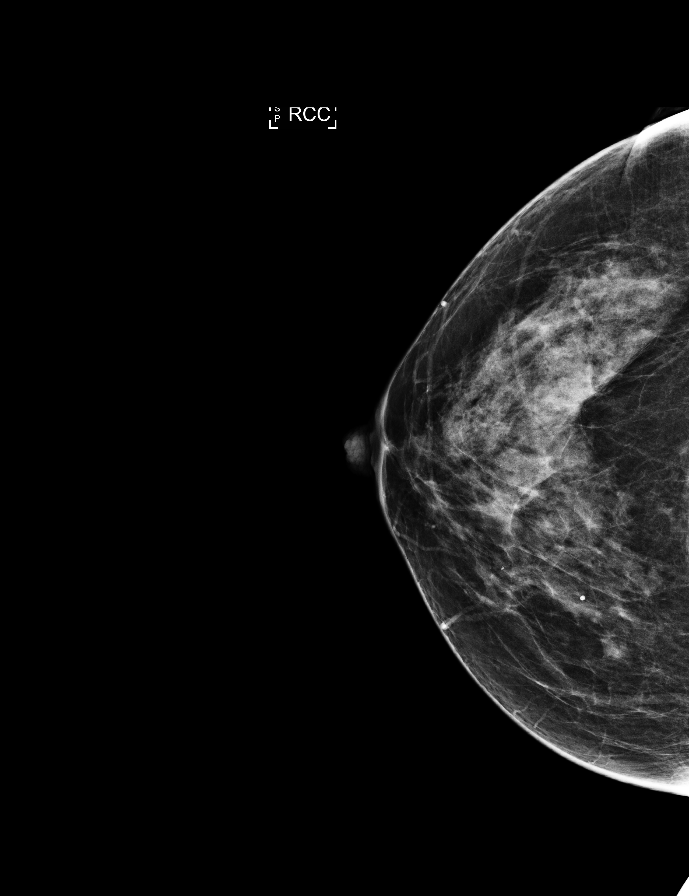

[R MLO]
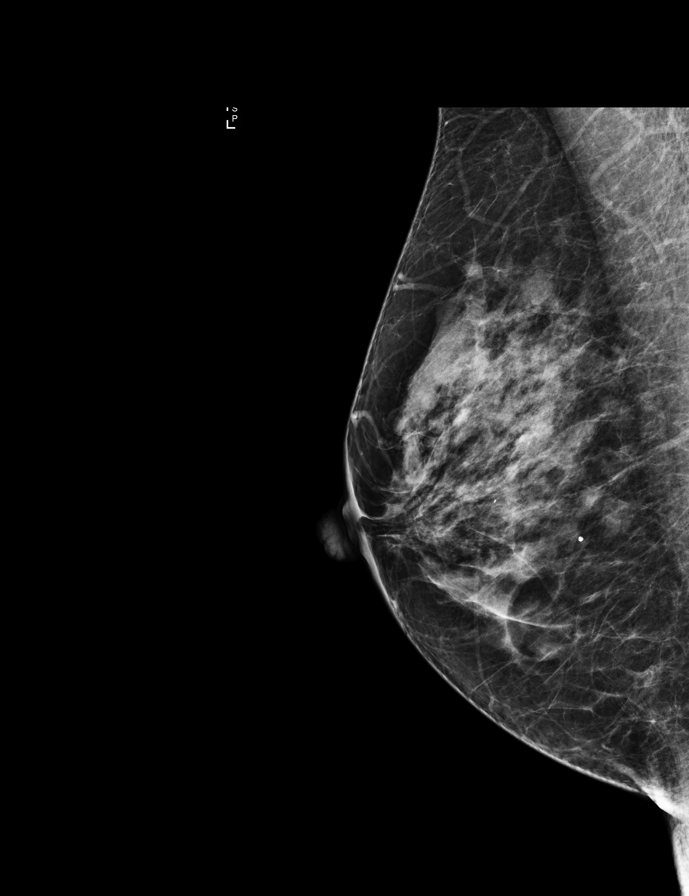

[L MLO]
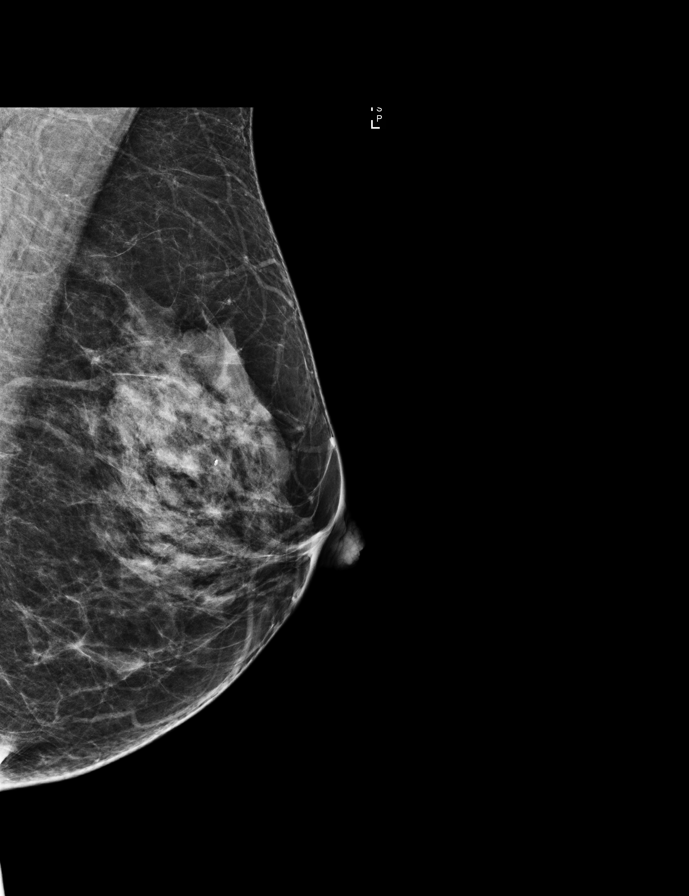

[L CC]
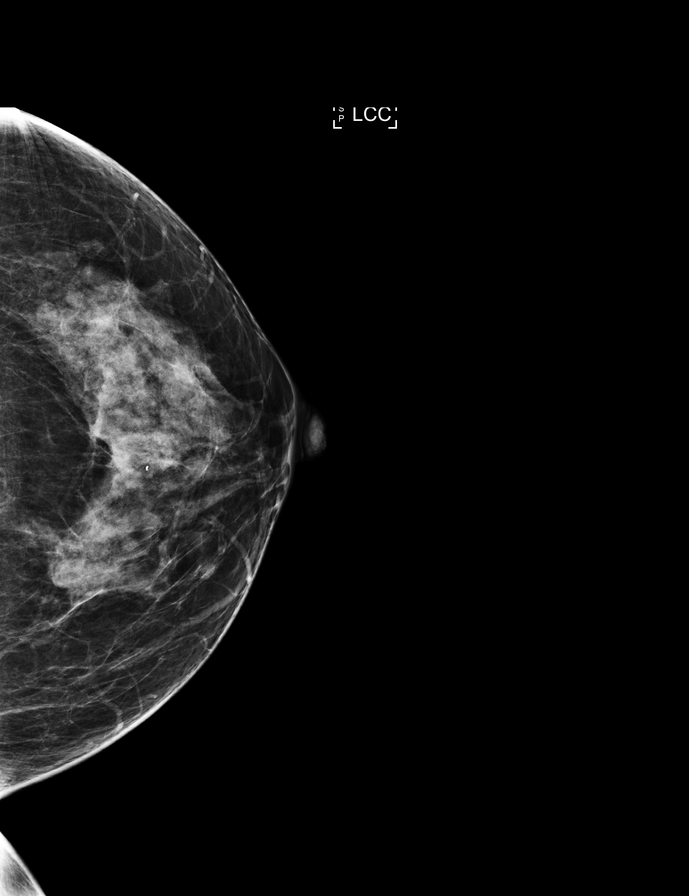

[L MLO tomo · tomo slice 29/58.0]
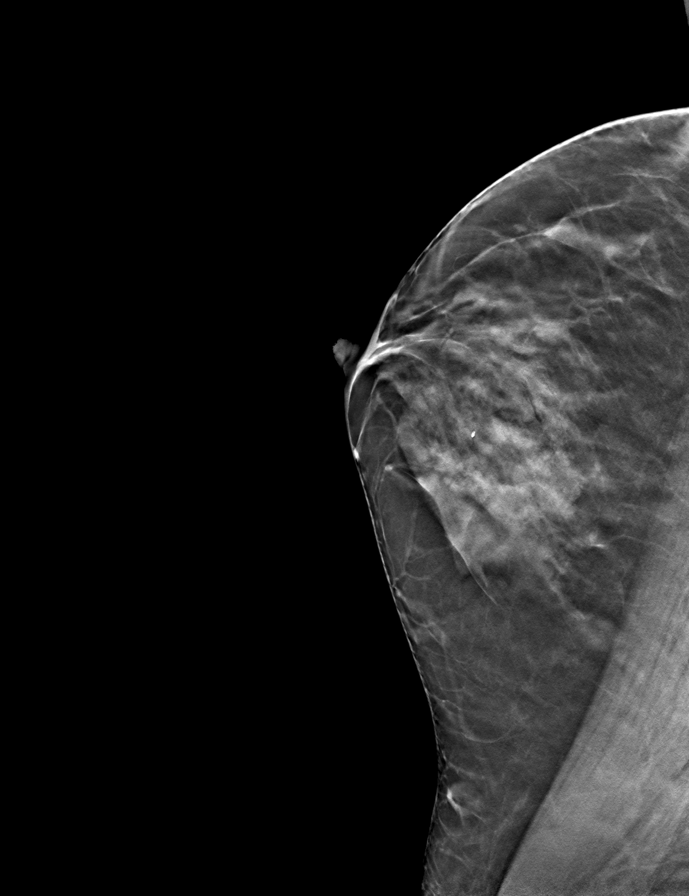

[9 of 28 positions shown; findings below may reference images not displayed]

ACR Breast Density Category c: The breast tissue is heterogeneously
dense, which may obscure small masses.
FINDINGS: There are no findings suspicious for malignancy. Images were
processed with CAD.
IMPRESSION: No mammographic evidence of malignancy. A result letter of this
screening mammogram will be mailed directly to the patient.

RECOMMENDATION:
Screening mammogram in one year. (Code:[SM])

BI-RADS CATEGORY  1: Negative.

## 2014-01-11 ENCOUNTER — Institutional Professional Consult (permissible substitution): Payer: BC Managed Care – PPO | Admitting: Cardiology

## 2014-01-24 ENCOUNTER — Encounter: Payer: Self-pay | Admitting: *Deleted

## 2014-01-24 ENCOUNTER — Encounter: Payer: Self-pay | Admitting: Cardiology

## 2014-01-25 ENCOUNTER — Encounter: Payer: Self-pay | Admitting: *Deleted

## 2014-01-25 ENCOUNTER — Encounter: Payer: Self-pay | Admitting: Cardiology

## 2014-01-25 ENCOUNTER — Ambulatory Visit (INDEPENDENT_AMBULATORY_CARE_PROVIDER_SITE_OTHER): Payer: BC Managed Care – PPO | Admitting: Cardiology

## 2014-01-25 VITALS — BP 124/78 | HR 65 | Ht 63.0 in | Wt 154.1 lb

## 2014-01-25 DIAGNOSIS — R079 Chest pain, unspecified: Secondary | ICD-10-CM | POA: Insufficient documentation

## 2014-01-25 NOTE — Progress Notes (Signed)
     HPI: 53 year old female for evaluation of chest pain. Laboratories in March of 2015 showed normal hemoglobin, Normal liver functions, normal renal function and normal TSH. Patient states that approximately one month ago she had chest pain. This was in the left chest area and described as a sharp pain. Duration less than 2 minutes. This was intermittent for approximately one month. No radiation or associated symptoms. Not exertional, pleuritic or positional. Not related to food. She did note some dyspnea on exertion. No orthopnea, PND or pedal edema. Occasional brief flutter but no sustained palpitations. No syncope.  No current outpatient prescriptions on file.   No current facility-administered medications for this visit.    No Known Allergies  Past Medical History  Diagnosis Date  . Depression     situational  . Chicken pox   . Situational stress   . Left knee injury   . HPV in female   . Deviated nasal septum     Past Surgical History  Procedure Laterality Date  . Dnc  1984  . Knee arthroscopy    . Nose surgery      History   Social History  . Marital Status: Single    Spouse Name: N/A    Number of Children: 2  . Years of Education: N/A   Occupational History  .     Social History Main Topics  . Smoking status: Never Smoker   . Smokeless tobacco: Not on file  . Alcohol Use: 0.0 oz/week     Comment: daily wine  . Drug Use: No  . Sexual Activity: Yes    Birth Control/ Protection: Surgical   Other Topics Concern  . Not on file   Social History Narrative  . No narrative on file    Family History  Problem Relation Age of Onset  . Hyperlipidemia Mother   . Hyperlipidemia Father   . Stroke Father   . Hypertension Father   . Arthritis Maternal Grandmother   . Cancer Maternal Grandmother     ROS: no fevers or chills, productive cough, hemoptysis, dysphasia, odynophagia, melena, hematochezia, dysuria, hematuria, rash, seizure activity, orthopnea, PND,  pedal edema, claudication. Remaining systems are negative.  Physical Exam:   Blood pressure 124/78, pulse 65, height 5\' 3"  (1.6 m), weight 154 lb 1.3 oz (69.89 kg).  General:  Well developed/well nourished in NAD Skin warm/dry Patient not depressed No peripheral clubbing Back-normal HEENT-normal/normal eyelids Neck supple/normal carotid upstroke bilaterally; no bruits; no JVD; no thyromegaly chest - CTA/ normal expansion CV - RRR/normal S1 and S2; no murmurs, rubs or gallops;  PMI nondisplaced Abdomen -NT/ND, no HSM, no mass, + bowel sounds, no bruit 2+ femoral pulses, no bruits Ext-no edema, chords, 2+ DP Neuro-grossly nonfocal  ECG Sinus rhythm with Diffuse mild ST depression

## 2014-01-25 NOTE — Assessment & Plan Note (Addendum)
Patient symptoms are atypical. I will arrange a stress echocardiogram for risk stratification. She has baseline ST depression. Note she has a strong family history of aortic dissection. She had 3 uncles who had aortic dissection. She has not had any chest pain in the past one month. Schedule echocardiogram to evaluate aortic root.

## 2014-01-25 NOTE — Patient Instructions (Addendum)
Your physician recommends that you schedule a follow-up appointment in: AS NEEDED PENDING TEST RESULTS  Your physician has requested that you have an echocardiogram. Echocardiography is a painless test that uses sound waves to create images of your heart. It provides your doctor with information about the size and shape of your heart and how well your heart's chambers and valves are working. This procedure takes approximately one hour. There are no restrictions for this procedure.   Your physician has requested that you have a stress echocardiogram. For further information please visit [email or web address]. Please follow instruction sheet as given.    

## 2014-02-01 ENCOUNTER — Other Ambulatory Visit: Payer: Self-pay | Admitting: *Deleted

## 2014-02-01 DIAGNOSIS — R079 Chest pain, unspecified: Secondary | ICD-10-CM

## 2014-02-10 ENCOUNTER — Telehealth: Payer: Self-pay | Admitting: Cardiology

## 2014-02-10 ENCOUNTER — Ambulatory Visit (HOSPITAL_COMMUNITY): Payer: BC Managed Care – PPO | Attending: Cardiology | Admitting: Radiology

## 2014-02-10 ENCOUNTER — Other Ambulatory Visit (HOSPITAL_COMMUNITY): Payer: BC Managed Care – PPO

## 2014-02-10 DIAGNOSIS — R079 Chest pain, unspecified: Secondary | ICD-10-CM

## 2014-02-10 DIAGNOSIS — I77819 Aortic ectasia, unspecified site: Secondary | ICD-10-CM | POA: Insufficient documentation

## 2014-02-10 DIAGNOSIS — R072 Precordial pain: Secondary | ICD-10-CM

## 2014-02-10 NOTE — Progress Notes (Signed)
Echocardiogram performed.  

## 2014-02-10 NOTE — Telephone Encounter (Signed)
New Message:  On 5/13 I called pt per Charmaine to let the pt know her insurance was not approved for the stress echo... Pt did not wish to resched for a exercise stress test at that time. On 5/29, per Vashti Hey, she stated I needed to send a message to Hilda Blades to make her aware.Marland Kitchen

## 2014-07-17 ENCOUNTER — Encounter: Payer: Self-pay | Admitting: Cardiology

## 2015-01-10 ENCOUNTER — Other Ambulatory Visit: Payer: Self-pay | Admitting: *Deleted

## 2015-01-10 ENCOUNTER — Telehealth: Payer: Self-pay | Admitting: *Deleted

## 2015-01-10 MED ORDER — SCOPOLAMINE 1 MG/3DAYS TD PT72: 1.0000 | MEDICATED_PATCH | TRANSDERMAL | Status: DC

## 2015-01-10 NOTE — Telephone Encounter (Signed)
Patient is going on a cruise and would like to have the patches called in to help with her motion sickness

## 2016-05-14 DIAGNOSIS — H524 Presbyopia: Secondary | ICD-10-CM | POA: Insufficient documentation

## 2018-06-03 ENCOUNTER — Encounter: Payer: Self-pay | Admitting: Family Medicine

## 2018-06-03 ENCOUNTER — Ambulatory Visit (INDEPENDENT_AMBULATORY_CARE_PROVIDER_SITE_OTHER): Payer: Managed Care, Other (non HMO) | Admitting: Family Medicine

## 2018-06-03 VITALS — BP 128/80 | HR 68 | Ht 63.0 in | Wt 150.0 lb

## 2018-06-03 DIAGNOSIS — N644 Mastodynia: Secondary | ICD-10-CM | POA: Diagnosis not present

## 2018-06-03 DIAGNOSIS — N63 Unspecified lump in unspecified breast: Secondary | ICD-10-CM | POA: Diagnosis not present

## 2018-06-03 DIAGNOSIS — Z7689 Persons encountering health services in other specified circumstances: Secondary | ICD-10-CM | POA: Diagnosis not present

## 2018-06-03 NOTE — Progress Notes (Signed)
Date:  06/03/2018   Name:  Heather Hatfield   DOB:  06-Feb-1961   MRN:  [id number]   Chief Complaint: Establish Care and Breast Pain (has stabbing pain in L) breast- had mammo 12/03/2017- can't see results) Patient is a 57 year old female who presents to establish care with new physician.. The patient reports the following problems: mastalgia/dense breast . Health maintenance has been reviewed and needs tdap. Mastalgia/assymetry inin feel/no change in nipple/ no discharge. No mass. Pain from  Noon to 6 Pain discribed as ache radiates to nipple./Sharp  Palpation of area 5 o clock /tender just outside of areola.  Other  This is a new (for mastagia) problem. The current episode started more than 1 month ago. The problem occurs intermittently. The problem has been waxing and waning. Associated symptoms include diaphoresis. Pertinent negatives include no abdominal pain, arthralgias, chills, congestion, coughing, fatigue, fever, headaches, myalgias, nausea, numbness, rash, sore throat, vomiting or weakness. Exacerbated by: menopausal. The treatment provided no relief.  Gynecologic Exam  The patient's pertinent negatives include no pelvic pain or vaginal discharge. Pertinent negatives include no abdominal pain, back pain, chills, constipation, diarrhea, dysuria, fever, flank pain, frequency, headaches, hematuria, nausea, rash, sore throat, urgency or vomiting.     Review of Systems  Constitutional: Positive for diaphoresis. Negative for chills, fatigue, fever and unexpected weight change.  HENT: Negative for congestion, ear discharge, ear pain, rhinorrhea, sinus pressure, sneezing and sore throat.   Eyes: Negative for photophobia, pain, discharge, redness and itching.  Respiratory: Negative for cough, shortness of breath, wheezing and stridor.   Gastrointestinal: Negative for abdominal pain, blood in stool, constipation, diarrhea, nausea and vomiting.  Endocrine: Negative for cold intolerance, heat  intolerance, polydipsia, polyphagia and polyuria.  Genitourinary: Negative for dysuria, flank pain, frequency, hematuria, menstrual problem, pelvic pain, urgency, vaginal bleeding and vaginal discharge.  Musculoskeletal: Negative for arthralgias, back pain and myalgias.  Skin: Negative for rash.  Allergic/Immunologic: Negative for environmental allergies and food allergies.  Neurological: Negative for dizziness, weakness, light-headedness, numbness and headaches.  Hematological: Negative for adenopathy. Does not bruise/bleed easily.  Psychiatric/Behavioral: Negative for dysphoric mood. The patient is not nervous/anxious.     Patient Active Problem List   Diagnosis Date Noted  . 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 Chest pain 01/25/2014  . 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 Situational stress 07/14/2013  . Depression 08/10/2012  . Deviated septum 08/10/2012  . Left knee injury 08/10/2012  . Routine gynecological examination 04/02/2012  . Screening for malignant neoplasm of the cervix 04/02/2012    No Known Allergies  Past Surgical History:  Procedure Laterality Date  . dnc  64  . KNEE ARTHROSCOPY    . NOSE SURGERY    . rotator cuff surgery Right     Social History   Tobacco Use  . Smoking status: Never Smoker  . Smokeless tobacco: Never Used  Substance Use Topics  . Alcohol use: Yes    Comment: daily wine  . Drug use: No     Medication list has been reviewed and updated.  No outpatient medications have been marked as taking for the 06/03/18 encounter (Office Visit) with Juline Patch, MD.    [facility] 2/9 Scores 06/03/2018 07/14/2013  PHQ - 2 Score 0 1  PHQ- 9 Score 1 -    Physical Exam  Constitutional: No distress.  HENT:  Head: Normocephalic and atraumatic.  Right Ear: External ear normal.  Left Ear: External ear normal.  Nose: Nose normal.  Mouth/Throat: Oropharynx is clear and moist.  Eyes: Pupils are equal, round, and reactive to light. Conjunctivae and EOM are  normal. Right eye exhibits no discharge. Left eye exhibits no discharge.  Neck: Normal range of motion. Neck supple. No JVD present. No thyromegaly present.  Cardiovascular: Normal rate, regular rhythm, normal heart sounds and intact distal pulses. Exam reveals no gallop and no friction rub.  No murmur heard. Pulmonary/Chest: Effort normal and breath sounds normal. She has no wheezes. She has no rales. Right breast exhibits no inverted nipple, no mass, no nipple discharge, no skin change and no tenderness. Left breast exhibits mass and tenderness. Left breast exhibits no inverted nipple, no nipple discharge and no skin change. No breast swelling, tenderness or discharge.    Abdominal: Soft. Bowel sounds are normal. She exhibits no mass. There is no tenderness. There is no guarding.  Musculoskeletal: Normal range of motion. She exhibits no edema.  Lymphadenopathy:    She has no cervical adenopathy.  Neurological: She is alert. She has normal reflexes.  Skin: Skin is warm and dry. She is not diaphoretic.  Nursing note and vitals reviewed.   BP 128/80   Pulse 68   Ht 5\' 3"  (1.6 m)   Wt 150 lb (68 kg)   LMP 11/16/2013   BMI 26.57 kg/m   Assessment and Plan: 1. Mastalgia in female Tenderness of lateral aspect of left breast radiates to nipple. - Ambulatory referral to General Surgery  2. Breast nodule Small pea size nodule with exquisite tenderness of area 5o:clock from areola. Refer to general surgery. Reviewed last mammogram report. - Ambulatory referral to General Surgery 3. Establishing care with new doctor, encounter for  Establish care with new physician.   Dr. [facility]  06/03/2018

## 2018-06-22 ENCOUNTER — Encounter: Payer: Self-pay | Admitting: Surgery

## 2018-06-22 ENCOUNTER — Ambulatory Visit: Payer: Managed Care, Other (non HMO) | Admitting: Surgery

## 2018-06-22 VITALS — BP 132/90 | HR 72 | Temp 97.9°F | Ht 63.0 in | Wt 152.6 lb

## 2018-06-22 DIAGNOSIS — N644 Mastodynia: Secondary | ICD-10-CM

## 2018-06-22 NOTE — Progress Notes (Signed)
Surgical Clinic History and Physical  Referring provider:  Juline Patch, MD [address], [place] [address]  HISTORY OF PRESENT ILLNESS (HPI):  57 y.o. female, originally from [place] who just 2 months ago moved from [place] to be closer to her children in [place], presents for evaluation of intermittent Left lateral breast pain and "heat" x 2 months. Patient initially described her pain as constant, but then further stated it sometimes feels like "burning pain" and at other times resolves without any discomfort. Patient denies any palpable mass, though a "pea-sized" nodule was reported at patient's recent first appointment with Dr. Ronnald Ramp since patient moved, and denies nipple discharge, fever/chills, or trauma. She recalls she did not breast feed either of her 2 children, had a maternal grandmother with breast cancer, has had no prior abnormal mammograms or biopsies, and regularly drinks coffee.  PAST MEDICAL HISTORY (PMH):  Past Medical History:  Diagnosis Date  . Chicken pox   . Depression    situational  . Deviated nasal septum   . HPV in female   . Left knee injury   . Situational stress      PAST SURGICAL HISTORY ([place]enn[place]):  Past Surgical History:  Procedure Laterality Date  . dnc  45  . KNEE ARTHROSCOPY    . NOSE SURGERY    . rotator cuff surgery Right      MEDICATIONS:  Prior to Admission medications   Not on File     ALLERGIES:  No Known Allergies   SOCIAL HISTORY:  Social History   Socioeconomic History  . Marital status: Single    Spouse name: Not on file  . Number of children: 2  . Years of education: Not on file  . Highest education level: Not on file  Occupational History    Employer: [employer]   Social Needs  . Financial resource strain: Not on file  . Food insecurity:    Worry: Not on file    Inability: Not on file  . Transportation needs:    Medical: Not on file    Non-medical: Not on file  Tobacco Use  . Smoking  status: Never Smoker  . Smokeless tobacco: Never Used  Substance and Sexual Activity  . Alcohol use: Yes    Comment: daily wine  . Drug use: No  . Sexual activity: Yes    Birth control/protection: Surgical  Lifestyle  . Physical activity:    Days per week: Not on file    Minutes per session: Not on file  . Stress: Not on file  Relationships  . Social connections:    Talks on phone: Not on file    Gets together: Not on file    Attends religious service: Not on file    Active member of club or organization: Not on file    Attends meetings of clubs or organizations: Not on file    Relationship status: Not on file  . Intimate partner violence:    Fear of current or ex partner: Not on file    Emotionally abused: Not on file    Physically abused: Not on file    Forced sexual activity: Not on file  Other Topics Concern  . Not on file  Social History Narrative  . Not on file    The patient currently resides (home / rehab facility / nursing home): Home The patient normally is (ambulatory / bedbound): Ambulatory  FAMILY HISTORY:  Family History  Problem Relation Age of Onset  .  Hyperlipidemia Mother   . Hyperlipidemia Father   . Hypertension Father   . Heart disease Father   . Arthritis Maternal Grandmother   . Cancer Maternal Grandmother   . Hypertension Maternal Grandfather   . Stroke Maternal Grandfather     Otherwise negative/non-contributory.  REVIEW OF SYSTEMS:  Constitutional: denies any other weight loss, fever, chills, or sweats  Eyes: denies any other vision changes, history of eye injury  ENT: denies sore throat, hearing problems  Respiratory: denies shortness of breath, wheezing  Cardiovascular: denies chest pain, palpitations  Breasts: pain, nipple discharge, and mass(es) as per HPI Gastrointestinal: denies abdominal pain, N/V, or diarrhea Musculoskeletal: denies any other joint pains or cramps  Skin: Denies any other rashes or skin discolorations except as  per HPI Neurological: denies any other headache, dizziness, weakness  Psychiatric: Denies any other depression, anxiety   All other review of systems were otherwise negative   VITAL SIGNS:  BP 132/90   Pulse 72   Temp 97.9 F (36.6 C) (Temporal)   Ht 5\' 3"  (1.6 m)   Wt 152 lb 9.6 oz (69.2 kg)   LMP 11/16/2013   BMI 27.03 kg/m    PHYSICAL EXAM:  Constitutional:  -- Normal body habitus  -- Awake, alert, and oriented x3  Eyes:  -- Pupils equally round and reactive to light  -- No scleral icterus  Ear, nose, throat:  -- No jugular venous distension -- No nasal drainage, bleeding Pulmonary:  -- No crackles  -- Equal breath sounds bilaterally -- Breathing non-labored at rest Cardiovascular:  -- S1, S2 present  -- No pericardial rubs  Breasts: -- No appreciable breast mass, though posterior mammary chest wall nodularity (bilaterally) -- No nipple discharge or axillary lymphadenopathy (bilaterally), unclear whether Left lateral chest wall or breast tenderness with only focal deep breast palpation -- No erythema or fluctuance appreciated (bilaterally) Gastrointestinal:  -- Abdomen soft, nontender, non-distended, no guarding/rebound  -- No abdominal masses appreciated, pulsatile or otherwise  Musculoskeletal and Integumentary:  -- Wounds or skin discoloration: None appreciated except as described above (Breasts) -- Extremities: B/L UE and LE FROM, hands and feet warm, no edema  Neurologic:  -- Motor function: Intact and symmetric -- Sensation: Intact and symmetric  Labs:  CBC Latest Ref Rng & Units 11/24/2013 04/02/2012  WBC 4.0 - 10.5 K/uL 7.1 7.7  Hemoglobin 12.0 - 15.0 g/dL 13.3 12.8  Hematocrit 36.0 - 46.0 % 39.4 38.6  Platelets 150 - 400 K/uL 358 280.0   CMP Latest Ref Rng & Units 11/24/2013 04/02/2012  Glucose 70 - 99 mg/dL 74 76  BUN 6 - 23 mg/dL 13 19  Creatinine 0.50 - 1.10 mg/dL 0.65 0.7  Sodium 135 - 145 mEq/L 139 137  Potassium 3.5 - 5.3 mEq/L 4.1 4.0   Chloride 96 - 112 mEq/L 102 104  CO2 19 - 32 mEq/L 27 26  Calcium 8.4 - 10.5 mg/dL 9.4 9.2  Total Protein 6.0 - 8.3 g/dL 7.2 7.2  Total Bilirubin 0.2 - 1.2 mg/dL 0.4 0.3  Alkaline Phos 39 - 117 U/L 60 53  AST 0 - 37 U/L 16 18  ALT 0 - 35 U/L 15 13   Imaging studies:  Screening Bilateral Mammogram (11/2017: [facility], report reviewed on patient's phone) BI-RADS 1: Negative  Assessment/Plan:  57 y.o. female with intermittent Left lateral breast menopausal mastalgia, complicated by co-morbidities including histories of situational stress and depression.   - reduce caffeine, salt, and alcohol consumption  - massage, ice, warm  compresses, and NSAID's prn  - though unremarkable recent mammogram (per report), ultrasound offered  - patient states she'd rather try above approaches for relief before additional imaging   - return to clinic in 1 month, instructed to call if any questions or concerns  All of the above recommendations were discussed with the patient, and all of patient's questions were answered to her expressed satisfaction.  Thank you for the opportunity to participate in this patient's care.  -- Marilynne Drivers Rosana Hoes, MD, [place]: [facility] - Partnering for exceptional care. Office: [phone]

## 2018-06-22 NOTE — Patient Instructions (Addendum)
Please try decreasing caffeine to see if this helps with the pain.  You may try Primrose oil.   Please see your follow up appointment listed below.

## 2018-07-07 ENCOUNTER — Other Ambulatory Visit: Payer: Self-pay | Admitting: *Deleted

## 2018-07-07 ENCOUNTER — Telehealth: Payer: Self-pay

## 2018-07-07 DIAGNOSIS — N644 Mastodynia: Secondary | ICD-10-CM

## 2018-07-07 NOTE — Telephone Encounter (Signed)
Order in [place] for left breast ultrasound.   I have left a message for the [facility] mammography department to call me back about scheduling this.   Patient may need to sign a release so [facility] can get previous mammograms before they will arrange.   Also, need to find out when patient will be on vacation so we can work around dates.

## 2018-07-07 NOTE — Telephone Encounter (Signed)
Per Lenna Sciara at [place], patient will need to stop by to sign a release so they can get previous mammograms from [facility].   The patient has been notified and will try and stop by [facility] tomorrow to sign the release.   Mammography will contact patient directly once they have records to get patient scheduled for her ultrasound. The patient verbalizes understanding.

## 2018-07-07 NOTE — Telephone Encounter (Signed)
Patient called and stated that when she was here to see Dr. Rosana Hoes, he had recommended for her to have an ultrasound. But she did not agree at that time. However, she changed her mind and wants to have it done. Can you please schedule the breast ultrasound. Patient is coming back to see Dr. Rosana Hoes on 08/05/2018. Patient stated that she would be going out of town, so she would like to schedule this ASAP. Thank you! Please see notes from Dr. Rosana Hoes from 06/22/2018 where he mentioned about the [place]

## 2018-07-13 ENCOUNTER — Other Ambulatory Visit: Payer: Self-pay | Admitting: Surgery

## 2018-07-13 DIAGNOSIS — N644 Mastodynia: Secondary | ICD-10-CM

## 2018-07-22 ENCOUNTER — Ambulatory Visit: Payer: Self-pay | Admitting: Surgery

## 2018-08-03 ENCOUNTER — Ambulatory Visit
Admission: RE | Admit: 2018-08-03 | Discharge: 2018-08-03 | Disposition: A | Discharge status: Home / Self Care | Payer: Managed Care, Other (non HMO) | Source: Ambulatory Visit | Attending: Surgery | Admitting: Surgery

## 2018-08-03 DIAGNOSIS — N644 Mastodynia: Secondary | ICD-10-CM

## 2018-08-03 IMAGING — MG DIGITAL DIAGNOSTIC UNILATERAL LEFT MAMMOGRAM WITH TOMO AND CAD
6 series · 6 of 18 positions shown · non-contrast
Comparison: Previous exam(s).

CLINICAL DATA: Left lateral breast burning sensation.

EXAM:
DIGITAL DIAGNOSTIC LEFT MAMMOGRAM WITH CAD AND TOMO
ULTRASOUND LEFT BREAST

[L CC synth-2D]
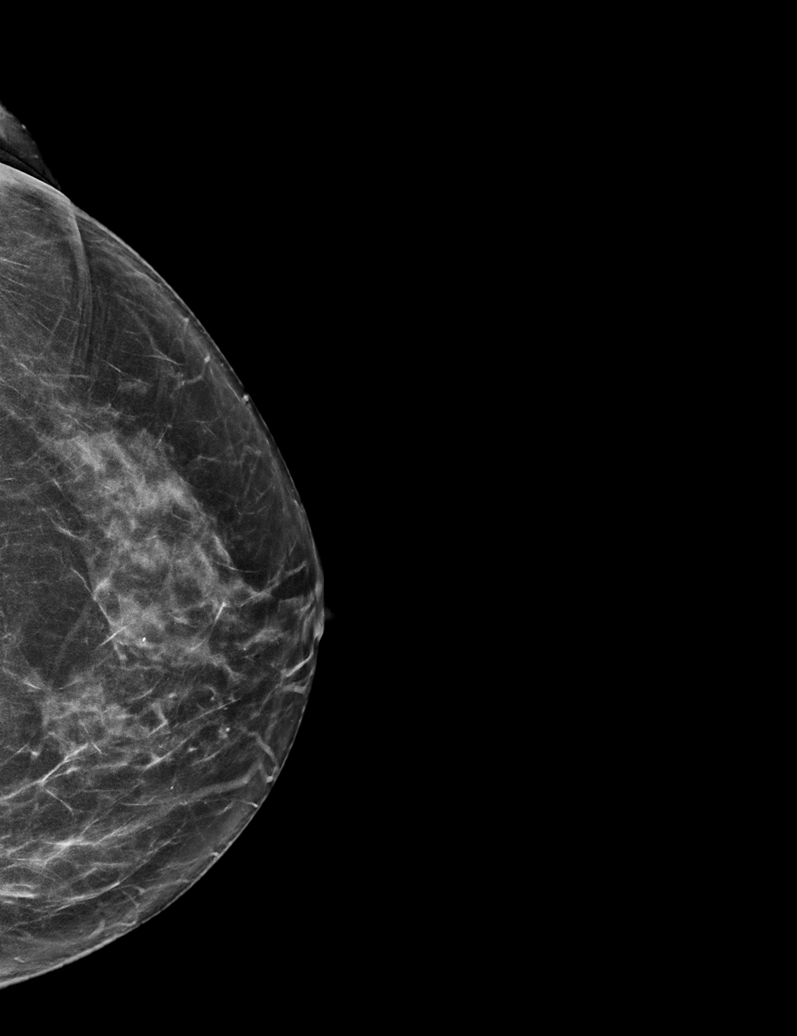

[L MLO synth-2D]
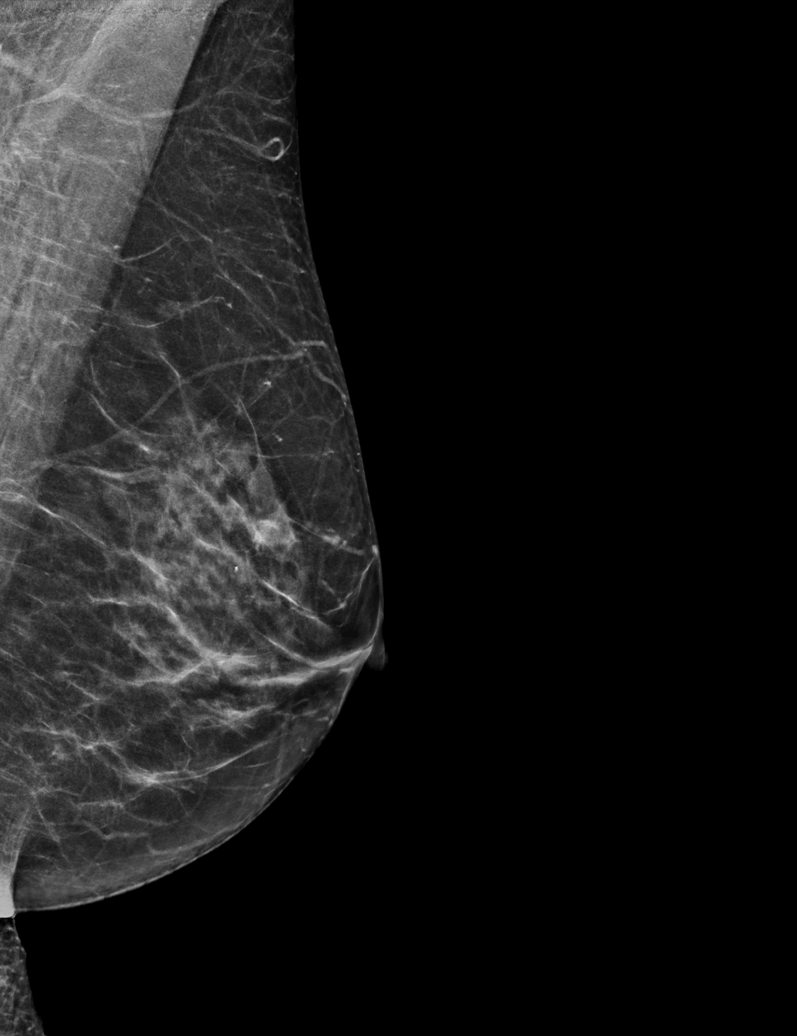

[L XCCL synth-2D]
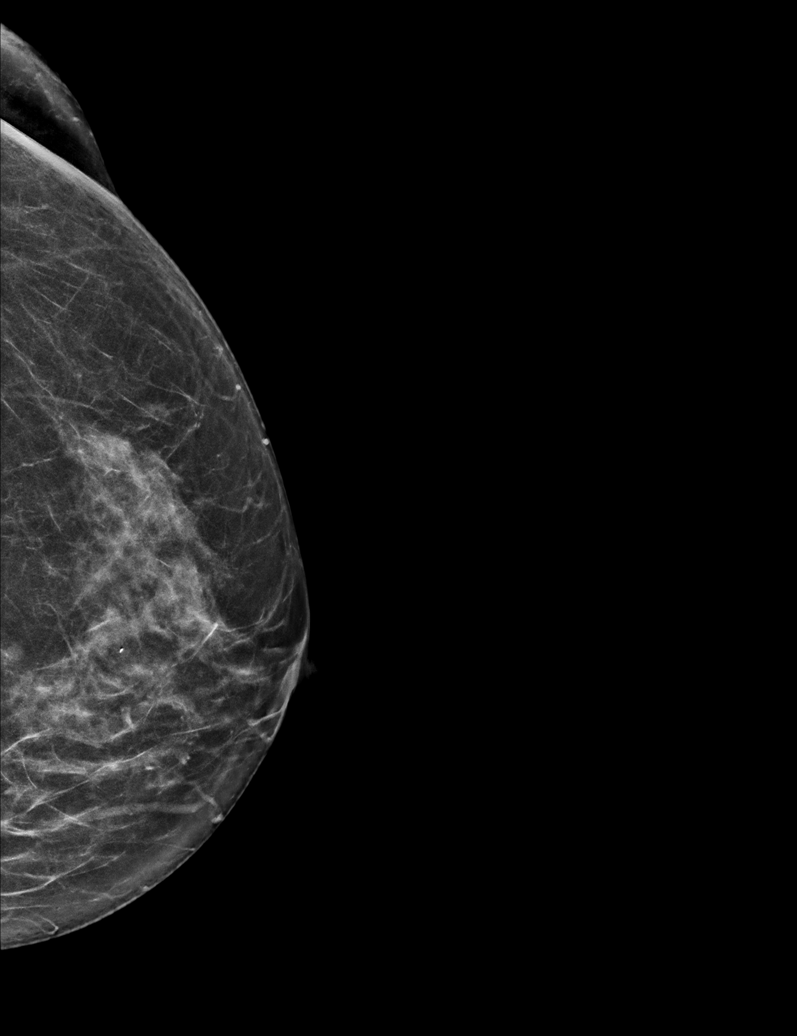

[L CC tomo · tomo slice 30/59.0]
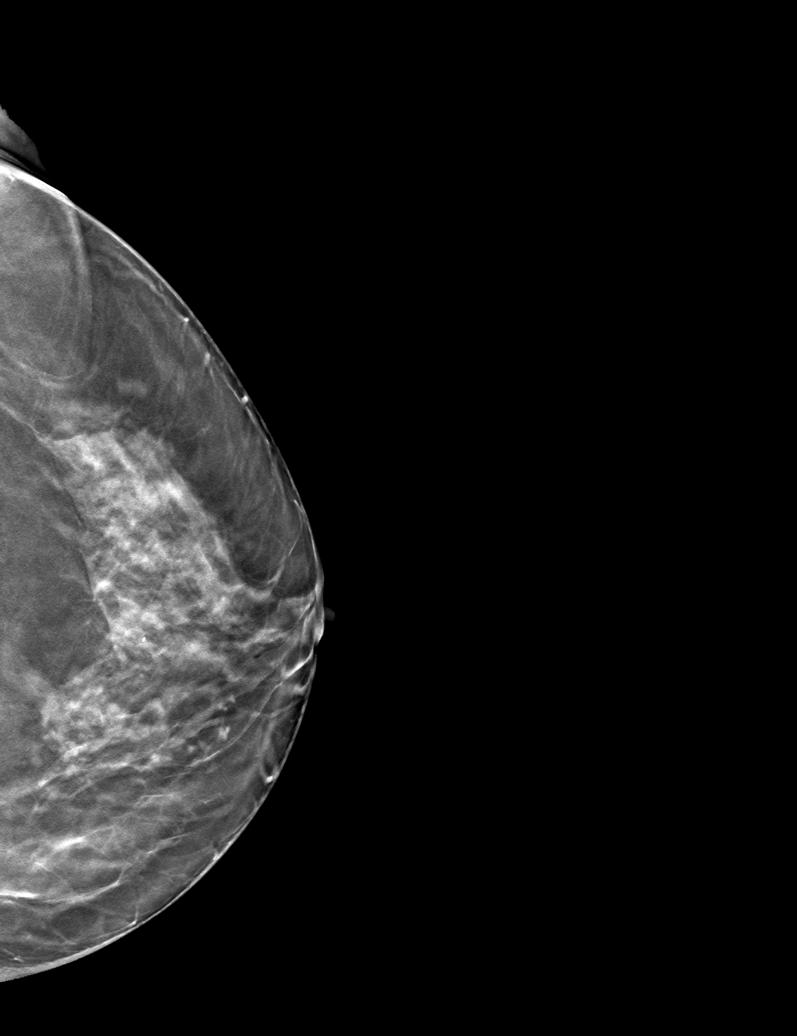

[L MLO tomo · tomo slice 27/53.0]
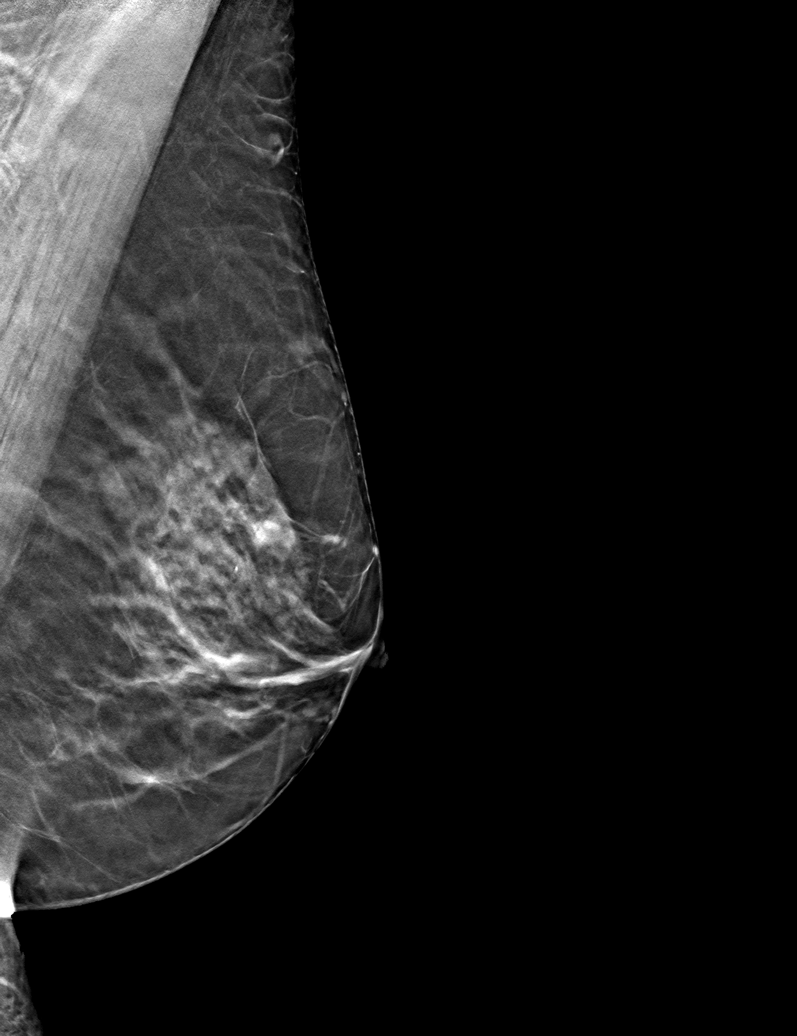

[L XCCL tomo · tomo slice 29/57.0]
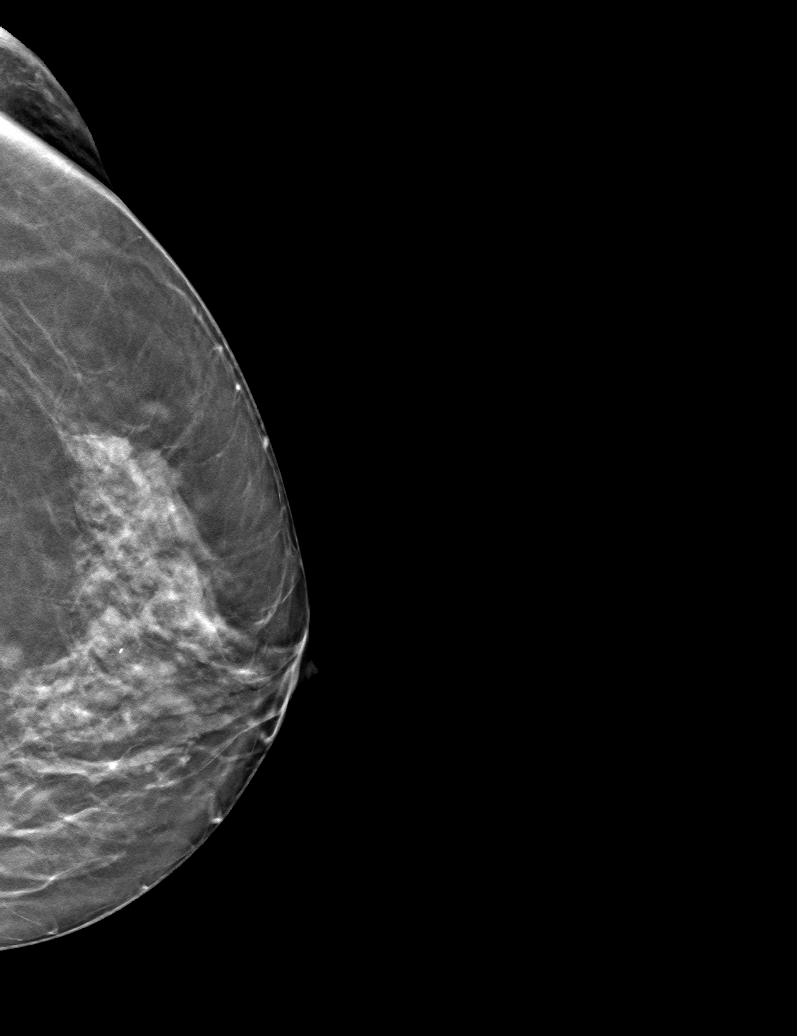

[6 of 18 positions shown; findings below may reference images not displayed]

ACR Breast Density Category c: The breast tissue is heterogeneously
dense, which may obscure small masses.
FINDINGS: Mammographically, there are no suspicious masses, areas of
architectural distortion or microcalcifications in the left breast.
Circumscribed benign-appearing subcentimeter nodule is seen in the
left breast upper outer quadrant, anterior depth, at the site of
previously documented benign cysts.

Mammographic images were processed with CAD.

On physical exam, no suspicious masses are palpated.

Targeted ultrasound is performed, showing left breast 1 o'clock 2 cm
from the nipple benign-appearing hypoechoic circumscribed nodule
measuring 0.4 x 0.3 x 0.5 cm. This likely represents a degenerating
cyst, and corresponds to the nodule seen mammographically.
IMPRESSION: No mammographic or sonographic evidence of malignancy the left
breast.

Benign-appearing left breast cyst.

RECOMMENDATION:
Bilateral screening mammogram in [DATE].

Further management of patient's left breast pain should be based on
clinical grounds.

I have discussed the findings and recommendations with the patient.
Results were also provided in writing at the conclusion of the
visit. If applicable, a reminder letter will be sent to the patient
regarding the next appointment.

BI-RADS CATEGORY  2: Benign.

## 2018-08-03 IMAGING — US ULTRASOUND LEFT BREAST LIMITED
1 series · 6 of 6 positions shown · non-contrast
Comparison: Previous exam(s).

CLINICAL DATA: Left lateral breast burning sensation.

EXAM:
DIGITAL DIAGNOSTIC LEFT MAMMOGRAM WITH CAD AND TOMO
ULTRASOUND LEFT BREAST

[Series 1: ultrasound left breast limited · 0.06mm/px · 6 of 6 slices shown]
[im 1/6]
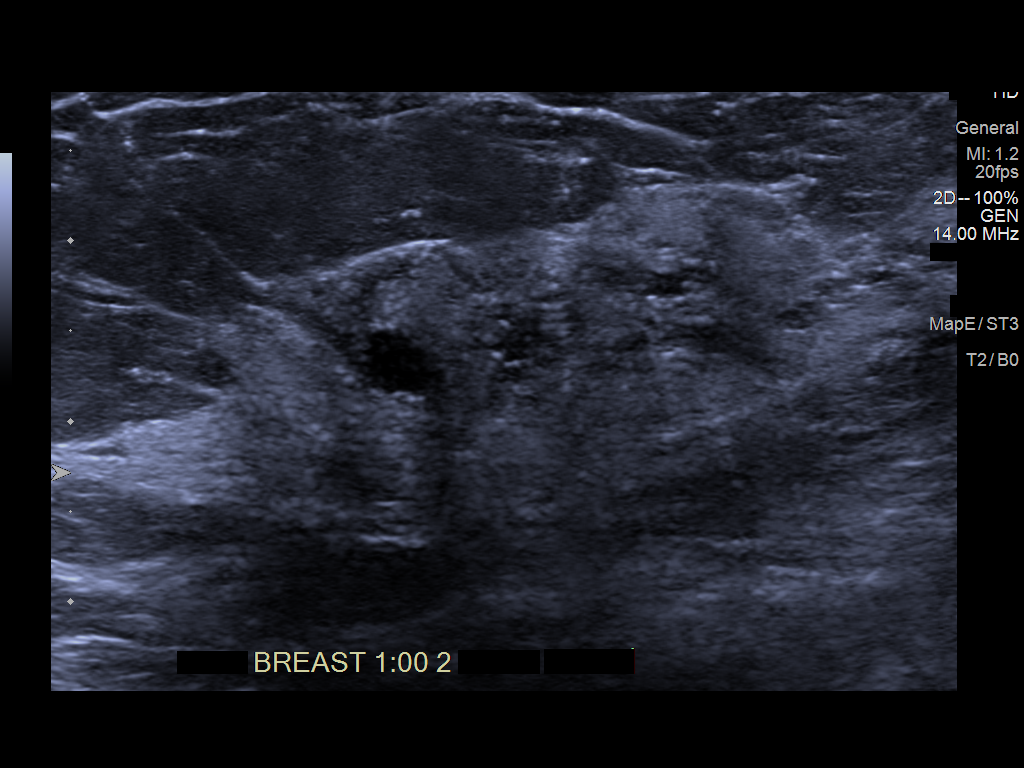
[im 2/6]
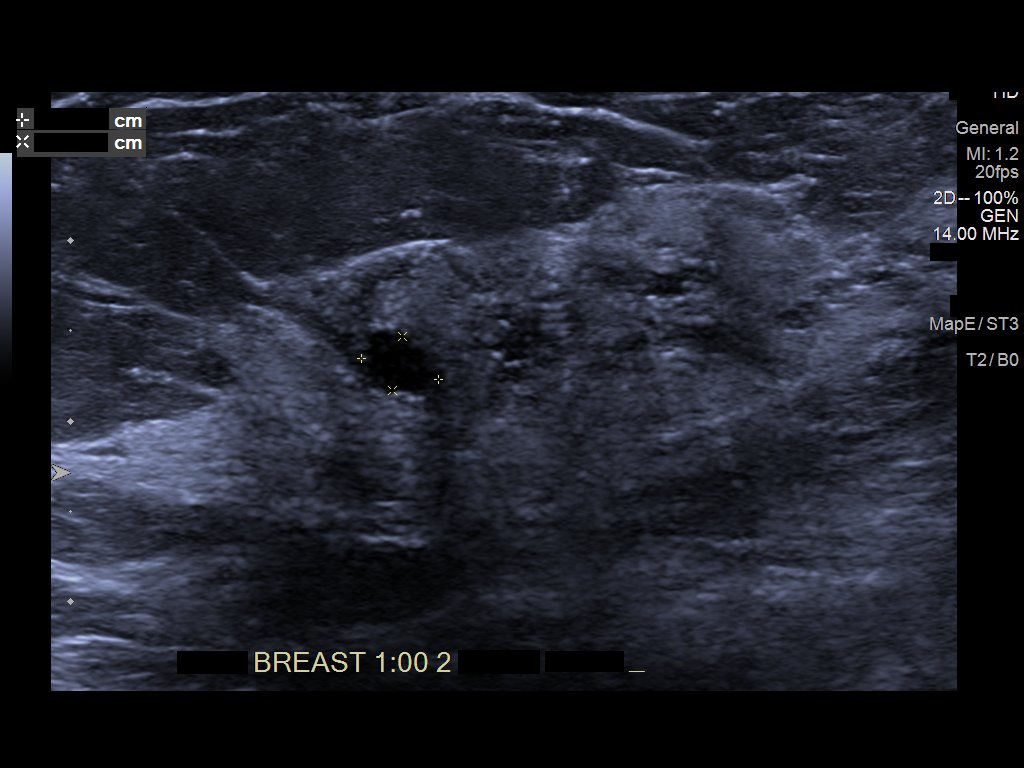
[im 3/6]
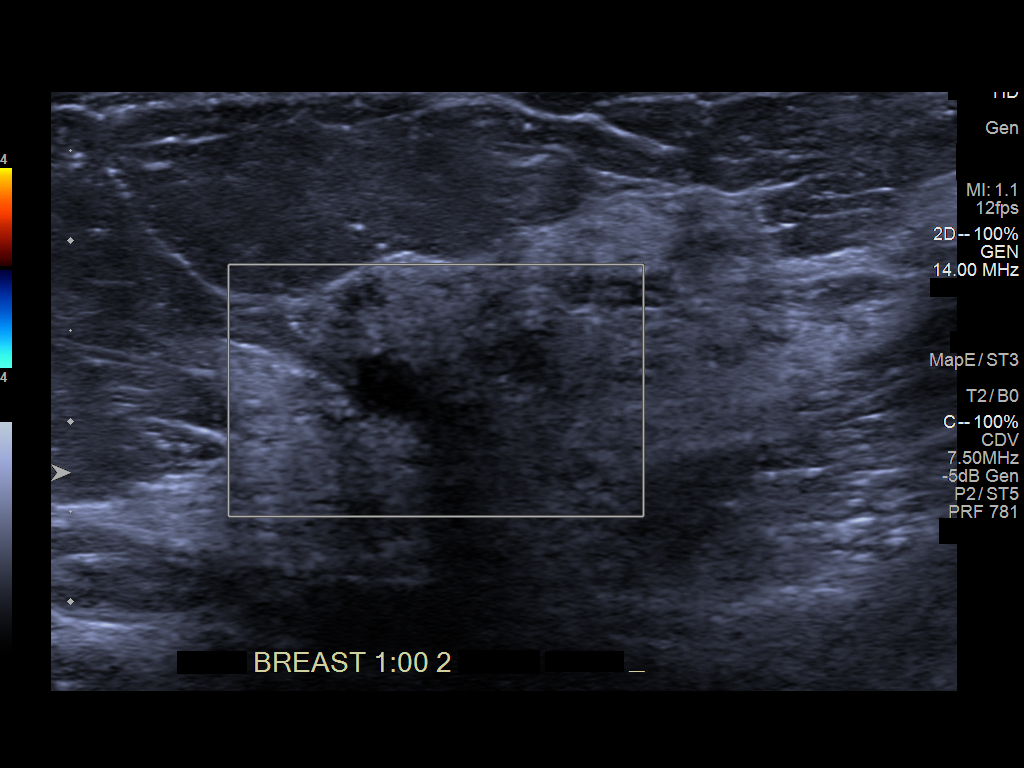
[im 4/6]
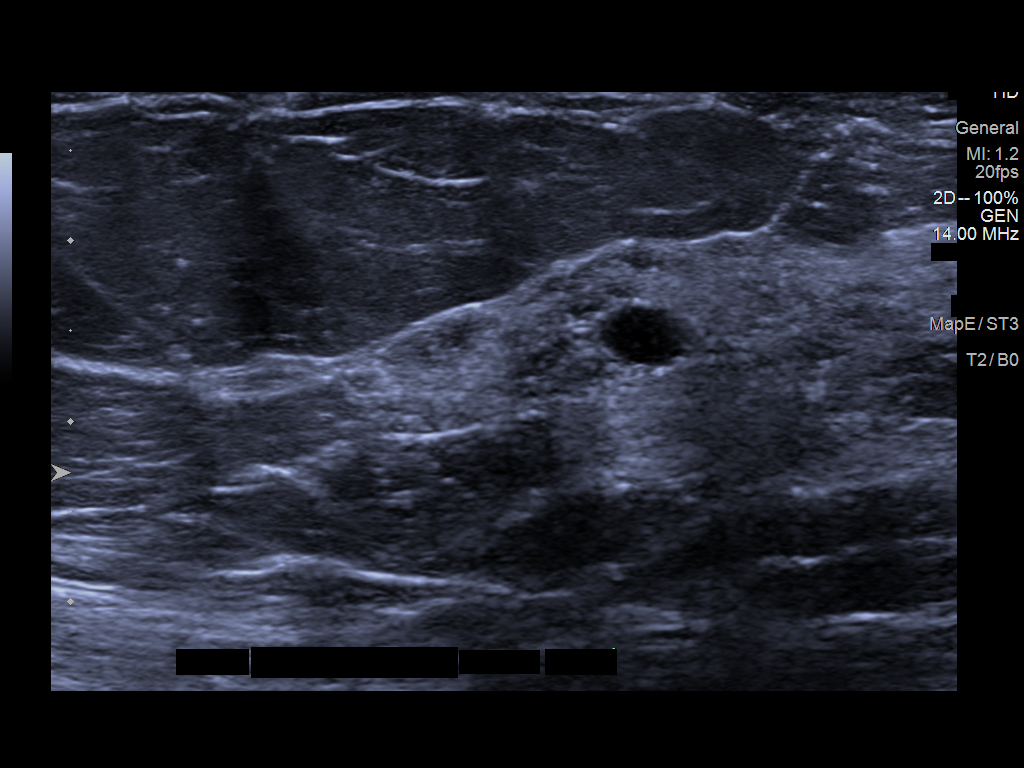
[im 5/6]
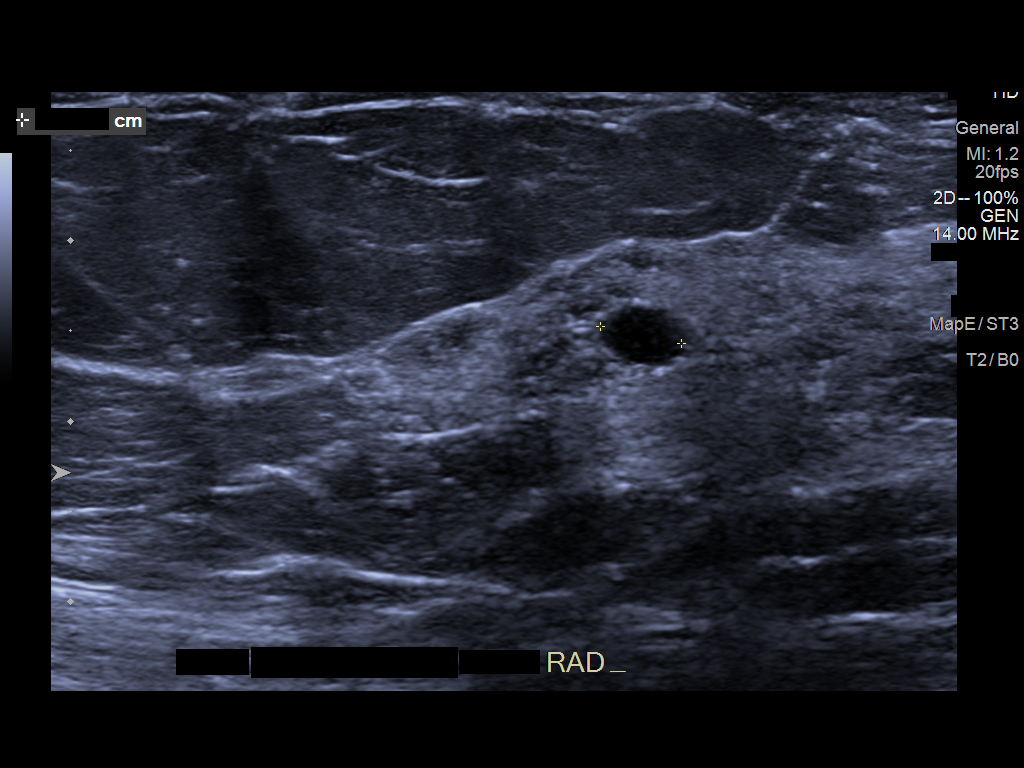
[im 6/6]
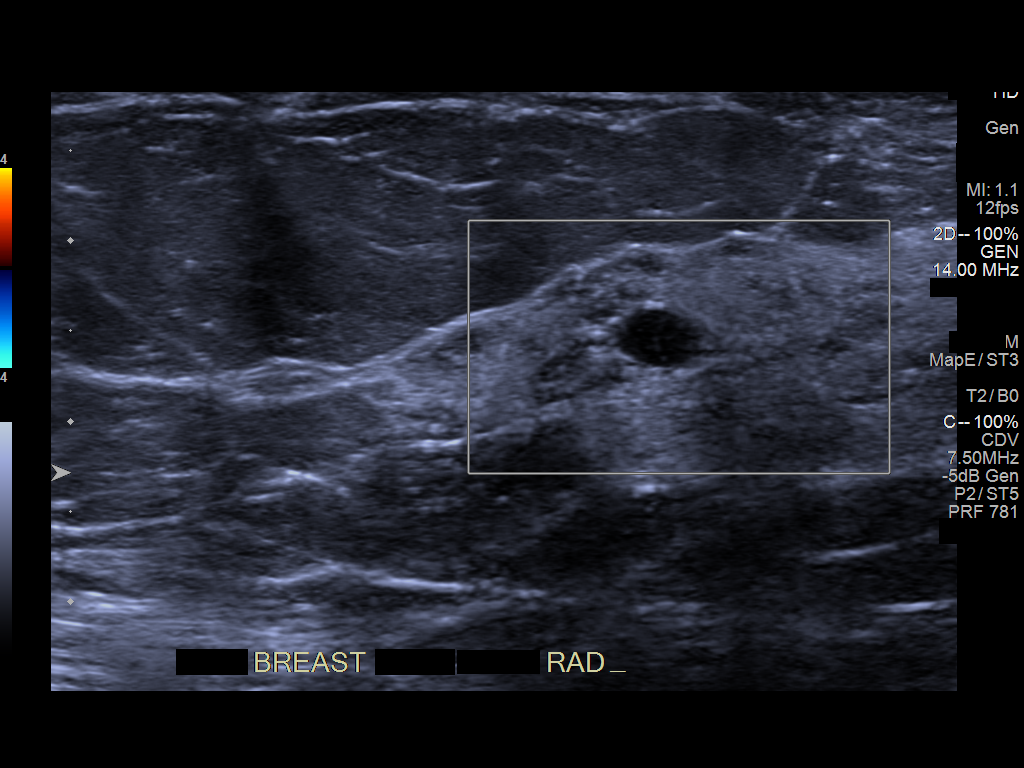

[6 of 6 positions shown; findings below may reference images not displayed]

ACR Breast Density Category c: The breast tissue is heterogeneously
dense, which may obscure small masses.
FINDINGS: Mammographically, there are no suspicious masses, areas of
architectural distortion or microcalcifications in the left breast.
Circumscribed benign-appearing subcentimeter nodule is seen in the
left breast upper outer quadrant, anterior depth, at the site of
previously documented benign cysts.

Mammographic images were processed with CAD.

On physical exam, no suspicious masses are palpated.

Targeted ultrasound is performed, showing left breast 1 o'clock 2 cm
from the nipple benign-appearing hypoechoic circumscribed nodule
measuring 0.4 x 0.3 x 0.5 cm. This likely represents a degenerating
cyst, and corresponds to the nodule seen mammographically.
IMPRESSION: No mammographic or sonographic evidence of malignancy the left
breast.

Benign-appearing left breast cyst.

RECOMMENDATION:
Bilateral screening mammogram in [DATE].

Further management of patient's left breast pain should be based on
clinical grounds.

I have discussed the findings and recommendations with the patient.
Results were also provided in writing at the conclusion of the
visit. If applicable, a reminder letter will be sent to the patient
regarding the next appointment.

BI-RADS CATEGORY  2: Benign.

## 2018-08-05 ENCOUNTER — Ambulatory Visit: Payer: Self-pay | Admitting: Surgery

## 2018-08-20 ENCOUNTER — Other Ambulatory Visit: Payer: Self-pay

## 2018-08-20 ENCOUNTER — Encounter: Payer: Self-pay | Admitting: Emergency Medicine

## 2018-08-20 ENCOUNTER — Ambulatory Visit
Admission: EM | Admit: 2018-08-20 | Discharge: 2018-08-20 | Disposition: A | Discharge status: Home / Self Care | Payer: Managed Care, Other (non HMO) | Attending: Family Medicine | Admitting: Family Medicine

## 2018-08-20 DIAGNOSIS — R300 Dysuria: Secondary | ICD-10-CM | POA: Diagnosis not present

## 2018-08-20 LAB — URINALYSIS, COMPLETE (UACMP) WITH MICROSCOPIC
BILIRUBIN URINE: NEGATIVE
Glucose, UA: NEGATIVE mg/dL
KETONES UR: NEGATIVE mg/dL
LEUKOCYTES UA: NEGATIVE
Nitrite: NEGATIVE
Protein, ur: NEGATIVE mg/dL
SPECIFIC GRAVITY, URINE: 1.01 (ref 1.005–1.030)
Squamous Epithelial / LPF: NONE SEEN (ref 0–5)
pH: 6.5 (ref 5.0–8.0)

## 2018-08-20 MED ORDER — CEPHALEXIN 500 MG PO CAPS: 500.0000 mg | ORAL_CAPSULE | Freq: Two times a day (BID) | ORAL | 0 refills | Status: AC

## 2018-08-20 NOTE — ED Provider Notes (Signed)
[facility] ____________________________________________  Time seen: Approximately 9:17 AM  I have reviewed the triage vital signs and the nursing notes.   HISTORY  Chief Complaint Dysuria   HPI Heather Hatfield is a 57 y.o. female presenting for evaluation of urinary frequency, urinary urgency and burning with urination that started early this morning around 5 AM.  Denies any accompanying abdominal pain, back pain, vaginal discharge, vaginal discomfort or fevers.  Is continued to eat and drink well.  Denies known trigger for same complaints.  States feels similar to previous UTI, but states she is only had one in approximately 15 years ago.  No alleviating measures attempted.  Denies aggravating factors.  States mild burning sensation currently.  Denies other complaints reports otherwise doing well.  Denies renal insufficiency.  No recent antibiotic use.  Juline Patch, MD: PCP   Past Medical History:  Diagnosis Date  . Chicken pox   . Depression    situational  . Deviated nasal septum   . HPV in female   . Left knee injury   . Situational stress     Patient Active Problem List   Diagnosis Date Noted  . 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 Chest pain 01/25/2014  . 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 Situational stress 07/14/2013  . Depression 08/10/2012  . Deviated septum 08/10/2012  . Left knee injury 08/10/2012  . Routine gynecological examination 04/02/2012  . Screening for malignant neoplasm of the cervix 04/02/2012    Past Surgical History:  Procedure Laterality Date  . dnc  61  . KNEE ARTHROSCOPY    . NOSE SURGERY    . rotator cuff surgery Right      No current facility-administered medications for this encounter.   Current Outpatient Medications:  .  cephALEXin (KEFLEX) 500 MG capsule, Take 1 capsule (500 mg total) by mouth 2 (two) times daily for 7 days., Disp: 14 capsule, Rfl: 0  Allergies Patient has no known allergies.  Family History    Problem Relation Age of Onset  . Hyperlipidemia Mother   . Hyperlipidemia Father   . Hypertension Father   . Heart disease Father   . Arthritis Maternal Grandmother   . Cancer Maternal Grandmother   . Breast cancer Maternal Grandmother   . Hypertension Maternal Grandfather   . Stroke Maternal Grandfather     Social History Social History   Tobacco Use  . Smoking status: Never Smoker  . Smokeless tobacco: Never Used  Substance Use Topics  . Alcohol use: Yes    Comment: daily wine  . Drug use: No    Review of Systems Constitutional: No fever Cardiovascular: Denies chest pain. Respiratory: Denies shortness of breath. Gastrointestinal: No abdominal pain.  Genitourinary: positive for dysuria. Musculoskeletal: Negative for back pain. Skin: Negative for rash.   ____________________________________________   PHYSICAL EXAM:  VITAL SIGNS: ED Triage Vitals  Enc Vitals Group     BP 08/20/18 0833 137/90     Pulse Rate 08/20/18 0833 71     Resp 08/20/18 0833 14     Temp 08/20/18 0833 97.9 F (36.6 C)     Temp Source 08/20/18 0833 Oral     SpO2 08/20/18 0833 99 %     Weight 08/20/18 0830 150 lb (68 kg)     Height 08/20/18 0830 5\' 3"  (1.6 m)     Head Circumference --      Peak Flow --      Pain Score 08/20/18 0829 3  Pain Loc --      Pain Edu? --      Excl. in [place]? --     Constitutional: Alert and oriented. Well appearing and in no acute distress. ENT      Head: Normocephalic and atraumatic. Cardiovascular: Normal rate, regular rhythm. Grossly normal heart sounds.  Good peripheral circulation. Respiratory: Normal respiratory effort without tachypnea nor retractions. Breath sounds are clear and equal bilaterally. No wheezes, rales, rhonchi. Gastrointestinal: Soft and nontender.No CVA tenderness. Musculoskeletal:  No midline cervical, thoracic or lumbar tenderness to palpation. Neurologic:  Normal speech and language. Speech is normal. No gait instability.  Skin:   Skin is warm, dry and intact. No rash noted. Psychiatric: Mood and affect are normal. Speech and behavior are normal. Patient exhibits appropriate insight and judgment   ___________________________________________   LABS (all labs ordered are listed, but only abnormal results are displayed)  Labs Reviewed  URINALYSIS, COMPLETE (UACMP) WITH MICROSCOPIC - Abnormal; Notable for the following components:      Result Value   Color, Urine STRAW (*)    Hgb urine dipstick MODERATE (*)    Bacteria, UA RARE (*)    All other components within normal limits  URINE CULTURE    PROCEDURES Procedures    INITIAL IMPRESSION / ASSESSMENT AND PLAN / ED COURSE  Pertinent labs & imaging results that were available during my care of the patient were reviewed by me and considered in my medical decision making (see chart for details).  Well-appearing patient.  No acute distress.  Presented for evaluation of dysuria.  Urinalysis reviewed, discussed with patient.  Urinalysis not clear UTI, but suggestive of.  We will culture urine and empirically start oral Keflex.  Encourage rest, fluids, supportive care.Discussed indication, risks and benefits of medications with patient.  Discussed follow up with Primary care physician this week as needed. Discussed follow up and return parameters including no resolution or any worsening concerns. Patient verbalized understanding and agreed to plan.   ____________________________________________   FINAL CLINICAL IMPRESSION(S) / ED DIAGNOSES  Final diagnoses:  Dysuria     ED Discharge Orders         Ordered    cephALEXin (KEFLEX) 500 MG capsule  2 times daily     08/20/18 0902           Note: This dictation was prepared with Dragon dictation along with smaller phrase technology. Any transcriptional errors that result from this process are unintentional.         Marylene Land, NP 08/20/18 [phone]

## 2018-08-20 NOTE — Discharge Instructions (Addendum)
Take medication as prescribed. Rest. Drink plenty of fluids.  ° °Follow up with your primary care physician this week as needed. Return to Urgent care for new or worsening concerns.  ° °

## 2018-08-20 NOTE — ED Triage Notes (Signed)
Patient c/o burning when urinating that started early this morning.

## 2018-08-21 LAB — URINE CULTURE: Culture: <10000 — AB

## 2018-09-22 ENCOUNTER — Telehealth: Payer: Self-pay

## 2018-09-22 NOTE — Telephone Encounter (Signed)
Patient Heather Hatfield  Asking what can she take for Sinus Inf already on Aleve but Teeth and facial pain severe and she is in [place].   Called and avised that if she fears Sinus Inf she needs seen for Abx. She tried several OTC including Mucinex and was sick for a bit prior to this with cold for few weeks. Advised Urgent Care in [place] or can continue to try OTC provided she ask pharmacist so it doe not counter with prescriptions. She will go to Urgent Care.

## 2018-09-29 ENCOUNTER — Encounter: Payer: Self-pay | Admitting: Emergency Medicine

## 2018-09-29 ENCOUNTER — Other Ambulatory Visit: Payer: Self-pay

## 2018-09-29 ENCOUNTER — Ambulatory Visit
Admission: EM | Admit: 2018-09-29 | Discharge: 2018-09-29 | Disposition: A | Discharge status: Home / Self Care | Payer: Managed Care, Other (non HMO) | Attending: Family Medicine | Admitting: Family Medicine

## 2018-09-29 DIAGNOSIS — R0981 Nasal congestion: Secondary | ICD-10-CM

## 2018-09-29 DIAGNOSIS — R05 Cough: Secondary | ICD-10-CM | POA: Diagnosis not present

## 2018-09-29 DIAGNOSIS — R0982 Postnasal drip: Secondary | ICD-10-CM | POA: Diagnosis not present

## 2018-09-29 DIAGNOSIS — J0100 Acute maxillary sinusitis, unspecified: Secondary | ICD-10-CM

## 2018-09-29 MED ORDER — AMOXICILLIN-POT CLAVULANATE 875-125 MG PO TABS: 1.0000 | ORAL_TABLET | Freq: Two times a day (BID) | ORAL | 0 refills | Status: DC

## 2018-09-29 NOTE — Discharge Instructions (Signed)
Take medication as prescribed. Rest. Drink plenty of fluids. Over the counter mucinex.   Follow up with your primary care physician this week as needed. Return to Urgent care for new or worsening concerns.   

## 2018-09-29 NOTE — ED Triage Notes (Signed)
Pt c/o sinus pain. Started about a week ago. She has been treating a cold OTC since christmas. She also has a lot of post nasal drip and tan, thick mucus.

## 2018-09-29 NOTE — ED Provider Notes (Signed)
[facility] ____________________________________________  Time seen: Approximately 1:45 PM  I have reviewed the triage vital signs and the nursing notes.   HISTORY  Chief Complaint Facial Pain (appt)   HPI Heather Hatfield is a 58 y.o. female presenting for evaluation of approximately 3 weeks of nasal congestion, postnasal drainage.  Reports that symptoms initial onset she had some chills, body aches but no fevers as well as increased coughing.  States she does still continue to have some cough but not as much.  States biggest complaint is having sinus pressure and tenderness, particularly left cheek.  Over-the-counter Mucinex and nasal rinses help but no resolution.  Continues eat and drink well.  Denies other aggravating alleviating factors.  Denies chest pain, shortness of breath, paresthesias, visual changes, weakness.  Juline Patch, MD: PCP   Past Medical History:  Diagnosis Date  . Chicken pox   . Depression    situational  . Deviated nasal septum   . HPV in female   . Left knee injury   . Situational stress     Patient Active Problem List   Diagnosis Date Noted  . 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 Chest pain 01/25/2014  . 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 Situational stress 07/14/2013  . Depression 08/10/2012  . Deviated septum 08/10/2012  . Left knee injury 08/10/2012  . Routine gynecological examination 04/02/2012  . Screening for malignant neoplasm of the cervix 04/02/2012    Past Surgical History:  Procedure Laterality Date  . dnc  73  . KNEE ARTHROSCOPY    . NOSE SURGERY    . rotator cuff surgery Right      No current facility-administered medications for this encounter.   Current Outpatient Medications:  .  amoxicillin-clavulanate (AUGMENTIN) 875-125 MG tablet, Take 1 tablet by mouth every 12 (twelve) hours., Disp: 20 tablet, Rfl: 0  Allergies Patient has no known allergies.  Family History  Problem Relation Age of Onset  .  Hyperlipidemia Mother   . Hyperlipidemia Father   . Hypertension Father   . Heart disease Father   . Arthritis Maternal Grandmother   . Cancer Maternal Grandmother   . Breast cancer Maternal Grandmother   . Hypertension Maternal Grandfather   . Stroke Maternal Grandfather     Social History Social History   Tobacco Use  . Smoking status: Never Smoker  . Smokeless tobacco: Never Used  Substance Use Topics  . Alcohol use: Yes    Comment: daily wine  . Drug use: No    Review of Systems Constitutional: No fever ENT: No sore throat. As above.  Cardiovascular: Denies chest pain. Respiratory: Denies shortness of breath. Gastrointestinal: No abdominal pain.   Musculoskeletal: Negative for back pain. Skin: Negative for rash.   ____________________________________________   PHYSICAL EXAM:  VITAL SIGNS: ED Triage Vitals  Enc Vitals Group     BP 09/29/18 1203 125/79     Pulse Rate 09/29/18 1203 69     Resp 09/29/18 1203 18     Temp 09/29/18 1203 98.1 F (36.7 C)     Temp Source 09/29/18 1203 Oral     SpO2 09/29/18 1203 98 %     Weight 09/29/18 1201 150 lb (68 kg)     Height 09/29/18 1201 5\' 3"  (1.6 m)     Head Circumference --      Peak Flow --      Pain Score 09/29/18 1200 6     Pain Loc --  Pain Edu? --      Excl. in [place]? --    Constitutional: Alert and oriented. Well appearing and in no acute distress. Eyes: Conjunctivae are normal.  Head: Atraumatic.Mild to moderate tenderness to palpation to left maxillary sinuses.  Minimal right maxillary tenderness.  No frontal sinus tenderness palpation.  No swelling. No erythema.   Ears: no erythema, normal TMs bilaterally.   Nose: nasal congestion with bilateral nasal turbinate erythema and edema.   Mouth/Throat: Mucous membranes are moist.  Oropharynx non-erythematous.No tonsillar swelling or exudate.  Neck: No stridor.  No cervical spine tenderness to palpation. Hematological/Lymphatic/Immunilogical: No cervical  lymphadenopathy. Cardiovascular: Normal rate, regular rhythm. Grossly normal heart sounds.  Good peripheral circulation. Respiratory: Normal respiratory effort.  No retractions. No wheezes, rales or rhonchi. Good air movement.  Musculoskeletal: Steady gait. Neurologic:  Normal speech and language. No gait instability. Skin:  Skin is warm, dry and intact. No rash noted. Psychiatric: Mood and affect are normal. Speech and behavior are normal. ___________________________________________   LABS (all labs ordered are listed, but only abnormal results are displayed)  Labs Reviewed - No data to display   PROCEDURES Procedures   INITIAL IMPRESSION / ASSESSMENT AND PLAN / ED COURSE  Pertinent labs & imaging results that were available during my care of the patient were reviewed by me and considered in my medical decision making (see chart for details).  Well-appearing patient.  No acute distress.  Suspect maxillary sinusitis.  Will treat with oral Augmentin.  Encourage saline rinses, Mucinex and supportive care.Discussed indication, risks and benefits of medications with patient.  Discussed follow up with Primary care physician this week. Discussed follow up and return parameters including no resolution or any worsening concerns. Patient verbalized understanding and agreed to plan.   ____________________________________________   FINAL CLINICAL IMPRESSION(S) / ED DIAGNOSES  Final diagnoses:  Acute maxillary sinusitis, recurrence not specified     ED Discharge Orders         Ordered    amoxicillin-clavulanate (AUGMENTIN) 875-125 MG tablet  Every 12 hours     09/29/18 1222           Note: This dictation was prepared with Dragon dictation along with smaller phrase technology. Any transcriptional errors that result from this process are unintentional.         Marylene Land, NP 09/29/18 1352

## 2018-09-30 ENCOUNTER — Ambulatory Visit: Payer: Managed Care, Other (non HMO) | Admitting: Surgery

## 2018-09-30 ENCOUNTER — Encounter: Payer: Self-pay | Admitting: *Deleted

## 2018-09-30 ENCOUNTER — Encounter: Payer: Self-pay | Admitting: Surgery

## 2018-09-30 ENCOUNTER — Other Ambulatory Visit: Payer: Self-pay

## 2018-09-30 VITALS — BP 125/81 | HR 74 | Temp 98.4°F | Resp 16 | Ht 63.0 in | Wt 154.8 lb

## 2018-09-30 DIAGNOSIS — N644 Mastodynia: Secondary | ICD-10-CM

## 2018-09-30 DIAGNOSIS — Z1239 Encounter for other screening for malignant neoplasm of breast: Secondary | ICD-10-CM

## 2018-09-30 NOTE — Progress Notes (Signed)
Surgical Clinic Progress/Follow-up Note   HPI:  58 y.o. Female presents to clinic for follow-up evaluation of Left lateral breast pain. Though patient had previously declined Left breast ultrasound to evaluate for Left breast mass or cyst in context of Left breast pain without nipple discharge or abnormal mammography, she describes that she a few weeks later reconsidered and proceeded to have Left breast ultrasound performed. Despite unremarkable ultrasound and unilateral Left diagnostic mammogram, she continues to report tolerable but persistent Left lateral breast pain, particularly with impact or pressure. She otherwise denies any palpable breast masses, nipple discharge, fever/chills, unintentional weight loss.  Review of Systems:  Constitutional: denies any other weight loss, fever, chills, or sweats  Eyes: denies any other vision changes, history of eye injury  ENT: denies sore throat, hearing problems  Respiratory: denies shortness of breath, wheezing  Cardiovascular: denies chest pain, palpitations Breasts: pain, mass(es), and nipple discharge as per interval history Gastrointestinal: denies abdominal pain, N/V, or diarrhea Musculoskeletal: denies any other joint pains or cramps  Skin: Denies any other rashes or skin discolorations  Neurological: denies any other headache, dizziness, weakness  Psychiatric: denies any other depression, anxiety  All other review of systems: otherwise negative   Vital Signs:  BP 125/81   Pulse 74   Temp 98.4 F (36.9 C) (Temporal)   Resp 16   Ht 5\' 3"  (1.6 m)   Wt 154 lb 12.8 oz (70.2 kg)   LMP 11/16/2013   BMI 27.42 kg/m    Physical Exam:  Constitutional:  -- Normal body habitus  -- Awake, alert, and oriented x3  Eyes:  -- Pupils equally round and reactive to light  -- No scleral icterus  Ear, nose, throat:  -- No jugular venous distension  -- No nasal drainage, bleeding Pulmonary:  -- No crackles -- Equal breath sounds  bilaterally -- Breathing non-labored at rest Cardiovascular:  -- S1, S2 present  -- No pericardial rubs  Breasts: -- No palpable breast mass(es) or axillary lymphadenopathy bilaterally -- Left lateral tenderness to palpation of muscular submammary chest wall nodularity not appreciated with palpation of breast parenchyma, NT nodularity of Right submammary chest wall -- No nipple discharge, erythema, or fluctuance appreciated Gastrointestinal:  -- Soft, nontender, non-distended, no guarding/rebound  -- No abdominal masses appreciated, pulsatile or otherwise  Musculoskeletal / Integumentary:  -- Wounds or skin discoloration: None appreciated  -- Extremities: B/L UE and LE FROM, hands and feet warm, no edema  Neurologic:  -- Motor function: intact and symmetric  -- Sensation: intact and symmetric   Imaging:  Diagnostic Left Breast Mammogram and Focused Ultrasound (08/03/2018) ACR Breast Density Category c: The breast tissue is heterogeneously dense, which may obscure small masses.  Mammographically, there are no suspicious masses, areas of architectural distortion, or microcalcifications in the left breast. Circumscribed benign-appearing subcentimeter nodule is seen  in the left breast upper outer quadrant, anterior depth, at the  site of previously documented benign cysts.  Ultrasound shows Left breast benign-appearing 0.4 x 0.3 x  0.5 cm hypoechoic circumscribed nodule measuring 1 o'clock  2 cm from the nipple. This likely represents a degenerating cyst  and corresponds to the nodule seen mammographically.  Assessment:  58 y.o. yo Female with a problem list including...  Patient Active Problem List   Diagnosis Date Noted  . 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 Chest pain 01/25/2014  . 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 Situational stress 07/14/2013  . Depression 08/10/2012  . Deviated septum 08/10/2012  . Left  knee injury 08/10/2012  . Routine gynecological examination 04/02/2012   . Screening for malignant neoplasm of the cervix 04/02/2012    presents to clinic for follow-up evaluation of Left lateral breast pain without mammographic or sonographic correlate/abnormality, complicated by comorbidities including histories of situational stress and depression.  Plan:   - recent imaging results discussed  - NSAID's and heat vs ice for symptomatic relief  - consider application of primrose oil and massage of Left lateral chest wall musculature  - will order annual B/L screening mammogram one year following prior screening mammogram performed in [place] (office to contact [facility] to confirm) per patient request  - return to clinic following annual bilateral screening mammogram  - instructed to call office if any questions or concerns  All of the above recommendations were discussed with the patient, and all of patient's questions were answered to her expressed satisfaction.  -- Marilynne Drivers Rosana Hoes, MD, [place]: [facility] - Partnering for exceptional care. Office: [phone]

## 2018-09-30 NOTE — Progress Notes (Signed)
Patient has been scheduled for a bilateral screening mammo at [facility] for 12-07-18 at 7:20 am.  The patient will follow up in the office with Dr. Rosana Hoes on 12-14-18 at 4 pm.   The patient is aware of the above and verbalizes understanding.

## 2018-09-30 NOTE — Patient Instructions (Addendum)
Try primrose oil and message the area to see if this helps.   Please call our office if you have questions or concerns.  We will check with [facility] regarding your last screening Mammogram was done and we will order your Mammogram this year.     Breast Self-Awareness Breast self-awareness means:  Knowing how your breasts look.  Knowing how your breasts feel.  Checking your breasts every month for changes.  Telling your doctor if you notice a change in your breasts. Breast self-awareness allows you to notice a breast problem early while it is still small. How to do a breast self-exam One way to learn what is normal for your breasts and to check for changes is to do a breast self-exam. To do a breast self-exam: Look for Changes  1. Take off all the clothes above your waist. 2. Stand in front of a mirror in a room with good lighting. 3. Put your hands on your hips. 4. Push your hands down. 5. Look at your breasts and nipples in the mirror to see if one breast or nipple looks different than the other. Check to see if: ? The shape of one breast is different. ? The size of one breast is different. ? There are wrinkles, dips, and bumps in one breast and not the other. 6. Look at each breast for changes in your skin, such as: ? Redness. ? Scaly areas. 7. Look for changes in your nipples, such as: ? Liquid around the nipples. ? Bleeding. ? Dimpling. ? Redness. ? A change in where the nipples are. Feel for Changes 1. Lie on your back on the floor. 2. Feel each breast. To do this, follow these steps: ? Pick a breast to feel. ? Put the arm closest to that breast above your head. ? Use your other arm to feel the nipple area of your breast. Feel the area with the pads of your three middle fingers by making small circles with your fingers. For the first circle, press lightly. For the second circle, press harder. For the third circle, press even harder. ? Keep making circles with your  fingers at the light, harder, and even harder pressures as you move down your breast. Stop when you feel your ribs. ? Move your fingers a little toward the center of your body. ? Start making circles with your fingers again, this time going up until you reach your collarbone. ? Keep making up and down circles until you reach your armpit. Remember to keep using the three pressures. ? Feel the other breast in the same way. 3. Sit or stand in the shower or tub. 4. With soapy water on your skin, feel each breast the same way you did in step 2, when you were lying on the floor. Write Down What You Find After doing the self-exam, write down:  What is normal for each breast.  Any changes you find in each breast.  When you last had your period.  How often should I check my breasts? Check your breasts every month. If you are breastfeeding, the best time to check them is after you feed your baby or after you use a breast pump. If you get periods, the best time to check your breasts is 5-7 days after your period is over. When should I see my doctor? See your doctor if you notice:  A change in shape or size of your breasts or nipples.  A change in the skin of your breast  or nipples, such as red or scaly skin.  Unusual fluid coming from your nipples.  A lump or thick area that was not there before.  Pain in your breasts.  Anything that concerns you. This information is not intended to replace advice given to you by your health care provider. Make sure you discuss any questions you have with your health care provider. Document Released: 02/18/2008 Document Revised: 02/07/2016 Document Reviewed: 07/22/2015 Elsevier Interactive Patient Education  2019 [employer].

## 2018-12-14 ENCOUNTER — Ambulatory Visit: Payer: Managed Care, Other (non HMO) | Admitting: Surgery

## 2019-01-18 ENCOUNTER — Ambulatory Visit: Payer: Managed Care, Other (non HMO) | Admitting: Surgery

## 2019-03-01 ENCOUNTER — Inpatient Hospital Stay: Admission: RE | Admit: 2019-03-01 | Payer: Managed Care, Other (non HMO) | Source: Ambulatory Visit

## 2019-05-26 ENCOUNTER — Encounter: Payer: Self-pay | Admitting: *Deleted

## 2020-09-26 ENCOUNTER — Other Ambulatory Visit: Payer: Self-pay | Admitting: Physician Assistant

## 2020-09-26 DIAGNOSIS — M25561 Pain in right knee: Secondary | ICD-10-CM

## 2020-09-26 DIAGNOSIS — S83221A Peripheral tear of medial meniscus, current injury, right knee, initial encounter: Secondary | ICD-10-CM

## 2020-10-04 ENCOUNTER — Ambulatory Visit
Admission: RE | Admit: 2020-10-04 | Discharge: 2020-10-04 | Disposition: A | Discharge status: Home / Self Care | Payer: 59 | Source: Ambulatory Visit | Attending: Physician Assistant | Admitting: Physician Assistant

## 2020-10-04 ENCOUNTER — Other Ambulatory Visit: Payer: Self-pay

## 2020-10-04 DIAGNOSIS — S83221A Peripheral tear of medial meniscus, current injury, right knee, initial encounter: Secondary | ICD-10-CM | POA: Insufficient documentation

## 2020-10-04 DIAGNOSIS — M25561 Pain in right knee: Secondary | ICD-10-CM | POA: Insufficient documentation

## 2020-10-04 IMAGING — MR MR KNEE*R* W/O CM
6 series · 40 of 40 positions shown · non-contrast
Comparison: None.

CLINICAL DATA: Medial right knee pain since [DATE] after
twisting injury while walking dog.

EXAM:
MRI OF THE RIGHT KNEE WITHOUT CONTRAST
TECHNIQUE: Multiplanar, multisequence MR imaging of the knee was performed. No
intravenous contrast was administered.

[Series 8: T2 fat-sat · axial · right · 4.0mm · 0.50mm/px · z∈[-88,+35]mm · 7 of 26 slices shown (1 of 3)]
[im 1/26]
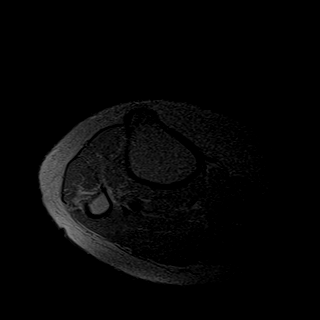
[im 5/26]
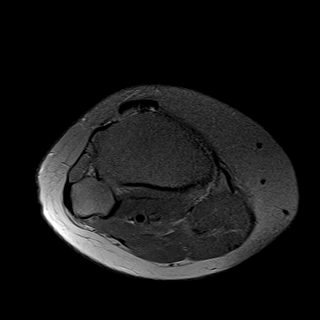
[im 9/26]
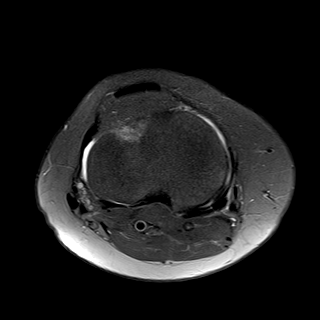
[im 13/26]
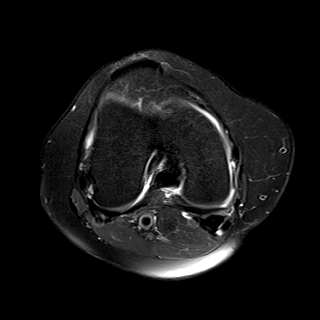
[im 17/26]
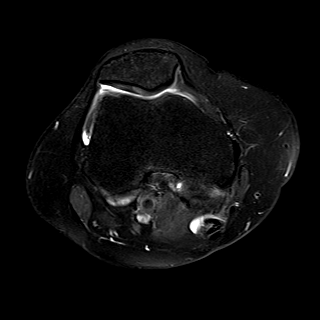
[im 21/26]
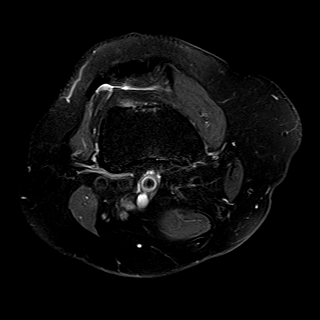
[im 26/26]
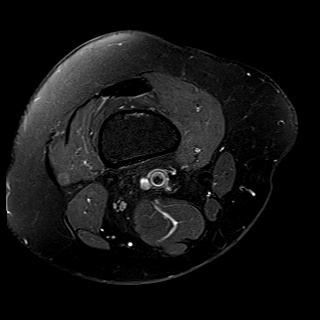

[Series 9: T2 fat-sat · coronal · right · 4.0mm · 0.59mm/px · 6 of 26 slices shown (2 of 3)]
[im 1/26]
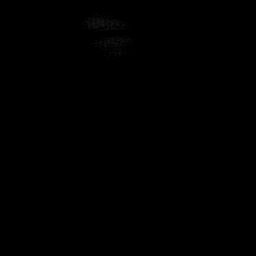
[im 6/26]
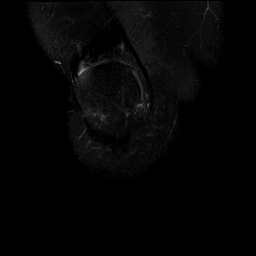
[im 11/26]
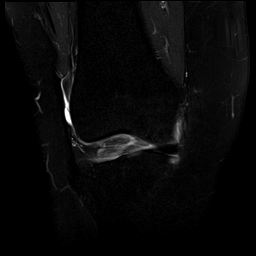
[im 16/26]
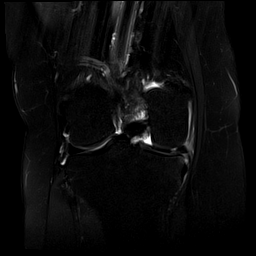
[im 21/26]
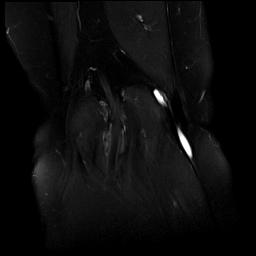
[im 26/26]
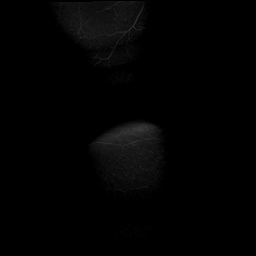

[Series 10: T1 · coronal · right · 4.0mm · 0.59mm/px · 6 of 26 slices shown]
[im 1/26]
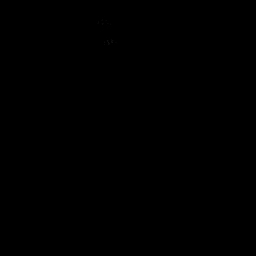
[im 6/26]
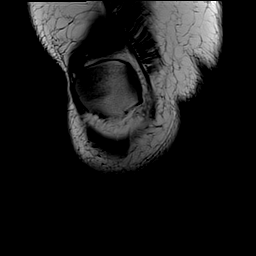
[im 11/26]
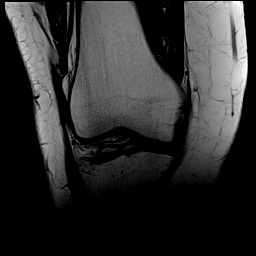
[im 16/26]
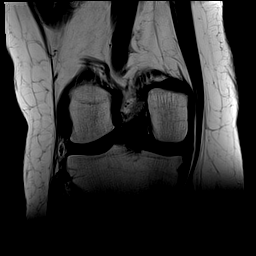
[im 21/26]
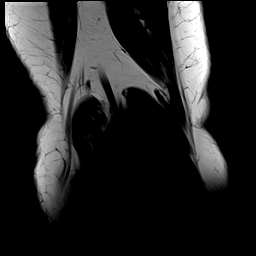
[im 26/26]
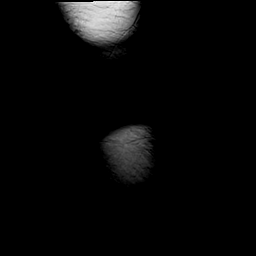

[Series 11: PD fat-sat · coronal · right · 4.0mm · 0.59mm/px · 6 of 26 slices shown (1 of 2)]
[im 1/26]
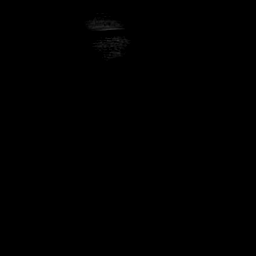
[im 6/26]
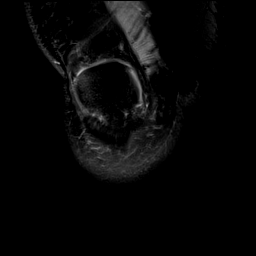
[im 11/26]
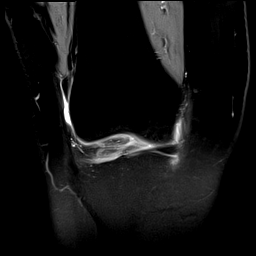
[im 16/26]
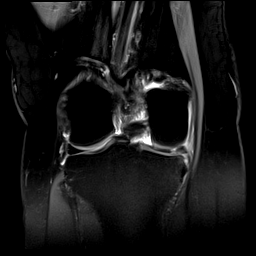
[im 21/26]
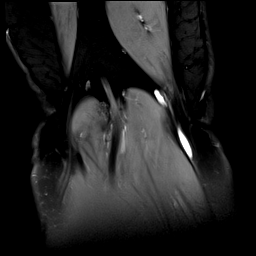
[im 26/26]
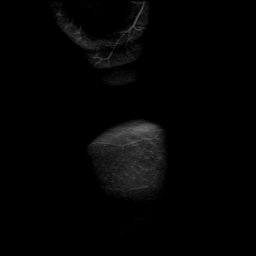

[Series 12: PD fat-sat · sagittal · right · 3.0mm · 0.59mm/px · 7 of 29 slices shown (2 of 2)]
[im 1/29]
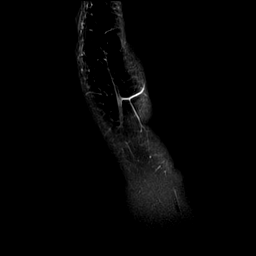
[im 5/29]
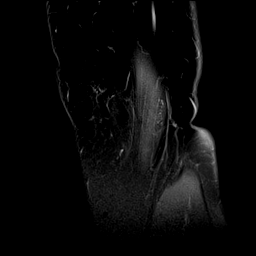
[im 10/29]
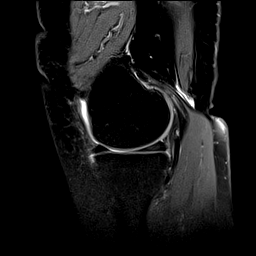
[im 15/29]
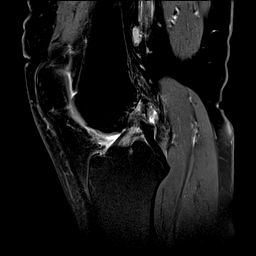
[im 19/29]
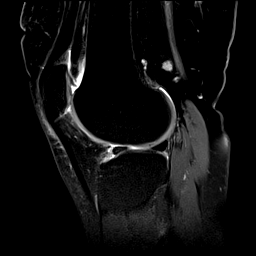
[im 24/29]
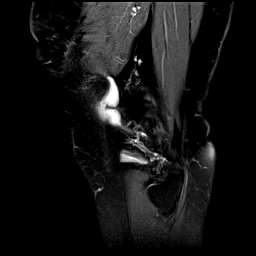
[im 29/29]
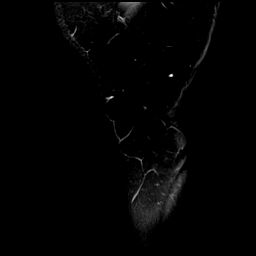

[Series 13: T2 fat-sat · sagittal · right · 3.0mm · 0.59mm/px · 8 of 32 slices shown (3 of 3)]
[im 1/32]
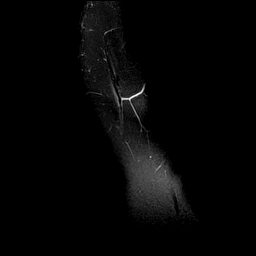
[im 5/32]
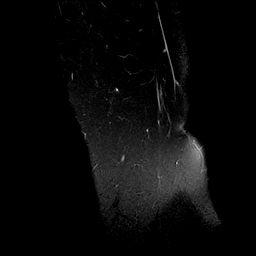
[im 9/32]
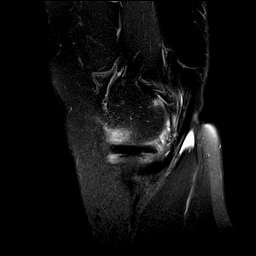
[im 14/32]
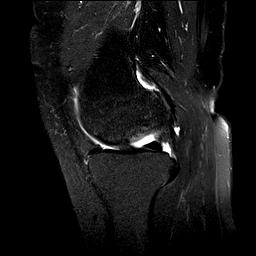
[im 18/32]
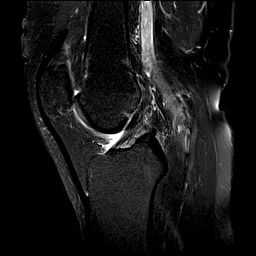
[im 23/32]
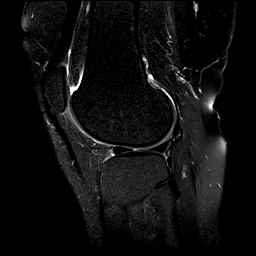
[im 27/32]
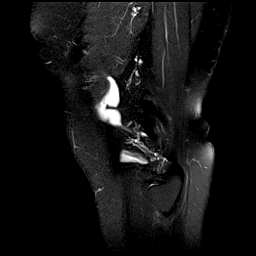
[im 32/32]
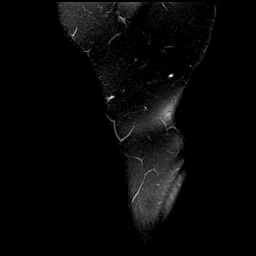

[40 of 40 positions shown; findings below may reference images not displayed]

FINDINGS: MENISCI

Medial meniscus: Radial tear of the posterior horn shown on image 23
of series 12 and image 17 of series 8.

Lateral meniscus:  Unremarkable

LIGAMENTS

Cruciates:  Unremarkable

Collaterals:  Unremarkable

CARTILAGE

Patellofemoral: Mild degenerative chondral thinning along the
patella.

Medial:  Mild degenerative chondral thinning.

Lateral:  Unremarkable

Joint:  Trace knee effusion.

Popliteal Fossa:  Small Baker's cyst.

Extensor Mechanism:  Unremarkable

Bones: No significant extra-articular osseous abnormalities
identified.

Other: No supplemental non-categorized findings.
IMPRESSION: 1. Radial tear of the posterior horn medial meniscus.
2. Mild degenerative chondral thinning in the patellofemoral joint
and medial compartment.
3. Trace knee effusion with small Baker's cyst.

## 2020-10-18 ENCOUNTER — Other Ambulatory Visit: Payer: Self-pay | Admitting: Orthopedic Surgery

## 2020-10-19 ENCOUNTER — Other Ambulatory Visit: Payer: Self-pay

## 2020-10-19 ENCOUNTER — Encounter: Payer: Self-pay | Admitting: Orthopedic Surgery

## 2020-10-23 ENCOUNTER — Other Ambulatory Visit: Payer: Self-pay

## 2020-10-23 ENCOUNTER — Other Ambulatory Visit
Admission: RE | Admit: 2020-10-23 | Discharge: 2020-10-23 | Disposition: A | Discharge status: Home / Self Care | Payer: 59 | Source: Ambulatory Visit | Attending: Orthopedic Surgery | Admitting: Orthopedic Surgery

## 2020-10-23 DIAGNOSIS — Z01812 Encounter for preprocedural laboratory examination: Secondary | ICD-10-CM | POA: Insufficient documentation

## 2020-10-23 DIAGNOSIS — U071 COVID-19: Secondary | ICD-10-CM | POA: Insufficient documentation

## 2020-10-23 NOTE — Pre-Procedure Instructions (Signed)
Patient states she was positive on a home covid test on 10/04/20.  No documentation of result of this test, so she was tested today for her upcoming procedure.

## 2020-10-24 LAB — SARS CORONAVIRUS 2 (TAT 6-24 HRS): SARS Coronavirus 2: POSITIVE — AB

## 2020-10-25 ENCOUNTER — Telehealth: Payer: Self-pay

## 2020-10-25 NOTE — Telephone Encounter (Signed)
Called to discuss with patient about COVID-19 symptoms and the use of one of the available treatments for those with mild to moderate Covid symptoms and at a high risk of hospitalization.  Pt appears to qualify for outpatient treatment due to co-morbid conditions and/or a member of an at-risk group in accordance with the FDA Emergency Use Authorization.    Symptom onset: Reports no symptoms Vaccinated: Yes Booster? Yes Immunocompromised? No Qualifiers: None  Pt. Reports she had COVID19 in January and is still testing positive. No symptoms today.  Heather Hatfield

## 2020-10-31 ENCOUNTER — Other Ambulatory Visit: Payer: 59 | Attending: Orthopedic Surgery

## 2020-11-02 ENCOUNTER — Encounter
Admission: RE | Disposition: A | Discharge status: Home / Self Care | Payer: Self-pay | Source: Home / Self Care | Attending: Orthopedic Surgery

## 2020-11-02 ENCOUNTER — Ambulatory Visit
Admission: RE | Admit: 2020-11-02 | Discharge: 2020-11-02 | Disposition: A | Discharge status: Home / Self Care | Payer: 59 | Attending: Orthopedic Surgery | Admitting: Orthopedic Surgery

## 2020-11-02 ENCOUNTER — Ambulatory Visit: Payer: 59 | Admitting: Anesthesiology

## 2020-11-02 ENCOUNTER — Encounter: Payer: Self-pay | Admitting: Orthopedic Surgery

## 2020-11-02 DIAGNOSIS — Z8616 Personal history of COVID-19: Secondary | ICD-10-CM | POA: Insufficient documentation

## 2020-11-02 DIAGNOSIS — S83221A Peripheral tear of medial meniscus, current injury, right knee, initial encounter: Secondary | ICD-10-CM | POA: Insufficient documentation

## 2020-11-02 DIAGNOSIS — S83241A Other tear of medial meniscus, current injury, right knee, initial encounter: Secondary | ICD-10-CM | POA: Diagnosis present

## 2020-11-02 DIAGNOSIS — X501XXA Overexertion from prolonged static or awkward postures, initial encounter: Secondary | ICD-10-CM | POA: Diagnosis not present

## 2020-11-02 HISTORY — PX: KNEE ARTHROSCOPY WITH MEDIAL MENISECTOMY: SHX5651

## 2020-11-02 SURGERY — ARTHROSCOPY, KNEE, WITH MEDIAL MENISCECTOMY
Anesthesia: General | Site: Knee | Laterality: Right

## 2020-11-02 MED ORDER — LIDOCAINE-EPINEPHRINE 1 %-1:100000 IJ SOLN
INTRAMUSCULAR | Status: DC | PRN
  Administered 2020-11-02: 8 mL via INTRAMUSCULAR
  Administered 2020-11-02: 2 mL via INTRAMUSCULAR

## 2020-11-02 MED ORDER — OXYCODONE HCL 5 MG/5ML PO SOLN: 5.0000 mg | Freq: Once | ORAL | Status: AC | PRN

## 2020-11-02 MED ORDER — PROPOFOL 10 MG/ML IV BOLUS
INTRAVENOUS | Status: DC | PRN
  Administered 2020-11-02: 140 mg via INTRAVENOUS

## 2020-11-02 MED ORDER — IBUPROFEN 800 MG PO TABS: 800.0000 mg | ORAL_TABLET | Freq: Three times a day (TID) | ORAL | 1 refills | Status: AC

## 2020-11-02 MED ORDER — LACTATED RINGERS IV SOLN: INTRAVENOUS | Status: DC

## 2020-11-02 MED ORDER — HYDROCODONE-ACETAMINOPHEN 5-325 MG PO TABS
1.0000 | ORAL_TABLET | ORAL | 0 refills | Status: DC | PRN
Start: 2020-11-02 — End: 2022-08-20

## 2020-11-02 MED ORDER — GLYCOPYRROLATE 0.2 MG/ML IJ SOLN
INTRAMUSCULAR | Status: DC | PRN
  Administered 2020-11-02: .1 mg via INTRAVENOUS

## 2020-11-02 MED ORDER — LIDOCAINE HCL (CARDIAC) PF 100 MG/5ML IV SOSY
PREFILLED_SYRINGE | INTRAVENOUS | Status: DC | PRN
  Administered 2020-11-02: 50 mg via INTRATRACHEAL

## 2020-11-02 MED ORDER — MIDAZOLAM HCL 5 MG/5ML IJ SOLN
INTRAMUSCULAR | Status: DC | PRN
  Administered 2020-11-02: 2 mg via INTRAVENOUS

## 2020-11-02 MED ORDER — DEXAMETHASONE SODIUM PHOSPHATE 4 MG/ML IJ SOLN
INTRAMUSCULAR | Status: DC | PRN
  Administered 2020-11-02: 4 mg via INTRAVENOUS

## 2020-11-02 MED ORDER — FENTANYL CITRATE (PF) 100 MCG/2ML IJ SOLN: 25.0000 ug | INTRAMUSCULAR | Status: DC | PRN

## 2020-11-02 MED ORDER — ACETAMINOPHEN 10 MG/ML IV SOLN
1000.0000 mg | Freq: Once | INTRAVENOUS | Status: AC
  Administered 2020-11-02: 1000 mg via INTRAVENOUS

## 2020-11-02 MED ORDER — FENTANYL CITRATE (PF) 100 MCG/2ML IJ SOLN
INTRAMUSCULAR | Status: DC | PRN
  Administered 2020-11-02: 25 ug via INTRAVENOUS
  Administered 2020-11-02: 50 ug via INTRAVENOUS
  Administered 2020-11-02: 25 ug via INTRAVENOUS

## 2020-11-02 MED ORDER — CEFAZOLIN SODIUM-DEXTROSE 2-4 GM/100ML-% IV SOLN
2.0000 g | INTRAVENOUS | Status: AC
  Administered 2020-11-02: 2 g via INTRAVENOUS

## 2020-11-02 MED ORDER — OXYCODONE HCL 5 MG PO TABS
5.0000 mg | ORAL_TABLET | Freq: Once | ORAL | Status: AC | PRN
  Administered 2020-11-02: 5 mg via ORAL

## 2020-11-02 MED ORDER — ACETAMINOPHEN 500 MG PO TABS: 1000.0000 mg | ORAL_TABLET | Freq: Three times a day (TID) | ORAL | 2 refills | Status: AC

## 2020-11-02 MED ORDER — ASPIRIN EC 325 MG PO TBEC: 325.0000 mg | DELAYED_RELEASE_TABLET | Freq: Every day | ORAL | 0 refills | Status: AC

## 2020-11-02 MED ORDER — ONDANSETRON HCL 4 MG/2ML IJ SOLN
INTRAMUSCULAR | Status: DC | PRN
  Administered 2020-11-02: 4 mg via INTRAVENOUS

## 2020-11-02 MED ORDER — ONDANSETRON 4 MG PO TBDP: 4.0000 mg | ORAL_TABLET | Freq: Three times a day (TID) | ORAL | 0 refills | Status: DC | PRN

## 2020-11-02 SURGICAL SUPPLY — 38 items
APL PRP STRL LF DISP 70% ISPRP (MISCELLANEOUS) ×1
APL SKNCLS STERI-STRIP NONHPOA (GAUZE/BANDAGES/DRESSINGS)
BENZOIN TINCTURE PRP APPL 2/3 (GAUZE/BANDAGES/DRESSINGS) IMPLANT
BLADE SURG SZ11 CARB STEEL (BLADE) ×2 IMPLANT
BNDG COHESIVE 4X5 TAN STRL (GAUZE/BANDAGES/DRESSINGS) ×2 IMPLANT
BNDG ESMARK 6X12 TAN STRL LF (GAUZE/BANDAGES/DRESSINGS) ×2 IMPLANT
BUR RADIUS 4.0X18.5 (BURR) IMPLANT
CHLORAPREP W/TINT 26 (MISCELLANEOUS) ×2 IMPLANT
COOLER POLAR GLACIER W/PUMP (MISCELLANEOUS) ×2 IMPLANT
COVER LIGHT HANDLE UNIVERSAL (MISCELLANEOUS) ×4 IMPLANT
CUFF TOURN SGL QUICK 30 (TOURNIQUET CUFF) ×2
CUFF TRNQT CYL 30X4X21-28X (TOURNIQUET CUFF) IMPLANT
DRAPE IMP U-DRAPE 54X76 (DRAPES) ×2 IMPLANT
GAUZE SPONGE 4X4 12PLY STRL (GAUZE/BANDAGES/DRESSINGS) ×2 IMPLANT
GLOVE SURG ENC MOIS LTX SZ7.5 (GLOVE) ×3 IMPLANT
GLOVE SURG UNDER POLY LF SZ7 (GLOVE) ×3 IMPLANT
GOWN SRG XL 47XLVL 3 (GOWN DISPOSABLE) IMPLANT
GOWN STRL NON-REIN TWL XL LVL3 (GOWN DISPOSABLE) ×2
GOWN STRL REUS W/ TWL LRG LVL3 (GOWN DISPOSABLE) ×1 IMPLANT
GOWN STRL REUS W/TWL LRG LVL3 (GOWN DISPOSABLE) ×2
IV LACTATED RINGER IRRG 3000ML (IV SOLUTION) ×4
IV LR IRRIG 3000ML ARTHROMATIC (IV SOLUTION) ×4 IMPLANT
KIT TURNOVER KIT A (KITS) ×2 IMPLANT
MANIFOLD NEPTUNE II (INSTRUMENTS) ×2 IMPLANT
MAT ABSORB  FLUID 56X50 GRAY (MISCELLANEOUS) ×1
MAT ABSORB FLUID 56X50 GRAY (MISCELLANEOUS) ×1 IMPLANT
PACK ARTHROSCOPY KNEE (MISCELLANEOUS) ×2 IMPLANT
PAD ABD DERMACEA PRESS 5X9 (GAUZE/BANDAGES/DRESSINGS) ×1 IMPLANT
PAD WRAPON POLAR KNEE (MISCELLANEOUS) ×1 IMPLANT
PADDING CAST BLEND 6X4 STRL (MISCELLANEOUS) ×1 IMPLANT
PADDING STRL CAST 6IN (MISCELLANEOUS) ×1
SET TUBE SUCT SHAVER OUTFL 24K (TUBING) ×2 IMPLANT
SUT ETHILON 3-0 FS-10 30 BLK (SUTURE) ×2
SUTURE EHLN 3-0 FS-10 30 BLK (SUTURE) ×1 IMPLANT
TOWEL OR 17X26 4PK STRL BLUE (TOWEL DISPOSABLE) ×4 IMPLANT
TUBING ARTHRO INFLOW-ONLY STRL (TUBING) ×2 IMPLANT
WAND WEREWOLF FLOW 90D (MISCELLANEOUS) IMPLANT
WRAPON POLAR PAD KNEE (MISCELLANEOUS) ×2

## 2020-11-02 NOTE — Transfer of Care (Signed)
Immediate Anesthesia Transfer of Care Note  Patient: Heather Hatfield  Procedure(s) Performed: Right knee arthroscopic partial medial meniscectomy (Right Knee)  Patient Location: PACU  Anesthesia Type: General  Level of Consciousness: awake, alert  and patient cooperative  Airway and Oxygen Therapy: Patient Spontanous Breathing and Patient connected to supplemental oxygen  Post-op Assessment: Post-op Vital signs reviewed, Patient's Cardiovascular Status Stable, Respiratory Function Stable, Patent Airway and No signs of Nausea or vomiting  Post-op Vital Signs: Reviewed and stable  Complications: No complications documented.

## 2020-11-02 NOTE — Anesthesia Postprocedure Evaluation (Signed)
Anesthesia Post Note  Patient: Heather Hatfield  Procedure(s) Performed: Right knee arthroscopic partial medial meniscectomy (Right Knee)     Patient location during evaluation: PACU Anesthesia Type: General Level of consciousness: awake Pain management: pain level controlled Vital Signs Assessment: post-procedure vital signs reviewed and stable Respiratory status: respiratory function stable Cardiovascular status: stable Postop Assessment: no signs of nausea or vomiting Anesthetic complications: no   No complications documented.  Veda Canning

## 2020-11-02 NOTE — Anesthesia Preprocedure Evaluation (Signed)
Anesthesia Evaluation  Patient identified by MRN, date of birth, ID band Patient awake    Reviewed: Allergy & Precautions, NPO status , Patient's Chart, lab work & pertinent test results  Airway Mallampati: I  TM Distance: >3 FB     Dental   Pulmonary neg recent URI, former smoker,    breath sounds clear to auscultation       Cardiovascular negative cardio ROS   Rhythm:Regular Rate:Normal     Neuro/Psych  Headaches, Depression    GI/Hepatic neg GERD  ,  Endo/Other  Hypothyroidism   Renal/GU      Musculoskeletal  (+) Arthritis ,   Abdominal   Peds  Hematology   Anesthesia Other Findings   Reproductive/Obstetrics                             Anesthesia Physical Anesthesia Plan  ASA: II  Anesthesia Plan: General   Post-op Pain Management:    Induction: Intravenous  PONV Risk Score and Plan: Treatment may vary due to age or medical condition  Airway Management Planned: LMA  Additional Equipment:   Intra-op Plan:   Post-operative Plan:   Informed Consent: I have reviewed the patients History and Physical, chart, labs and discussed the procedure including the risks, benefits and alternatives for the proposed anesthesia with the patient or authorized representative who has indicated his/her understanding and acceptance.     Dental advisory given  Plan Discussed with: CRNA  Anesthesia Plan Comments:         Anesthesia Quick Evaluation

## 2020-11-02 NOTE — Anesthesia Procedure Notes (Signed)
Procedure Name: LMA Insertion Date/Time: 11/02/2020 11:07 AM Performed by: Mayme Genta, CRNA Pre-anesthesia Checklist: Patient identified, Emergency Drugs available, Suction available, Timeout performed and Patient being monitored Patient Re-evaluated:Patient Re-evaluated prior to induction Oxygen Delivery Method: Circle system utilized Preoxygenation: Pre-oxygenation with 100% oxygen Induction Type: IV induction LMA: LMA inserted LMA Size: 3.0 Number of attempts: 1 Placement Confirmation: positive ETCO2 and breath sounds checked- equal and bilateral Tube secured with: Tape

## 2020-11-02 NOTE — Discharge Instructions (Signed)
Arthroscopic Knee Surgery - Partial Meniscectomy   Post-Op Instructions   1. Bracing or crutches: Crutches will be provided at the time of discharge from the surgery center if you do not already have them.   2. Ice: You may be provided with a device (Polar Care) that allows you to ice the affected area effectively. Otherwise you can ice manually.    3. Driving:  Plan on not driving for at least two weeks. Please note that you are advised NOT to drive while taking narcotic pain medications as you may be impaired and unsafe to drive.   4. Activity: Ankle pumps several times an hour while awake to prevent blood clots. Weight bearing: as tolerated. Use crutches for as needed (usually ~1 week or less) until pain allows you to ambulate without a limp. Bending and straightening the knee is unlimited. Elevate knee above heart level as much as possible for one week. Avoid standing more than 5 minutes (consecutively) for the first week.  Avoid long distance travel for 2 weeks.  5. Medications:  - You have been provided a prescription for narcotic pain medicine. After surgery, take 1-2 narcotic tablets every 4 hours if needed for severe pain.  - You may take up to 3000mg/day of tylenol (acetaminophen). You can take 1000mg 3x/day. Please check your narcotic. If you have acetaminophen in your narcotic (each tablet will be 325mg), be careful not to exceed a total of 3000mg/day of acetaminophen.  - A prescription for anti-nausea medication will be provided in case the narcotic medicine or anesthesia causes nausea - take 1 tablet every 6 hours only if nauseated.  - Take ibuprofen 800 mg every 8 hours WITH food to reduce post-operative knee swelling. DO NOT STOP IBUPROFEN POST-OP UNTIL INSTRUCTED TO DO SO at first post-op office visit (10-14 days after surgery). However, please discontinue if you have any abdominal discomfort after taking this.  - Take enteric coated aspirin 325 mg once daily for 2 weeks to prevent  blood clots.    6. Bandages: The physical therapist should change the bandages at the first post-op appointment. If needed, the dressing supplies have been provided to you.   7. Physical Therapy: 1-2 times per week for 6 weeks. Therapy typically starts on post operative Day 3 or 4. You have been provided an order for physical therapy. The therapist will provide home exercises.   8. Work: May return to full work usually around 2 weeks after 1st post-operative visit. May do light duty/desk job in approximately 1-2 weeks when off of narcotics, pain is well-controlled, and swelling has decreased. Labor intensive jobs may require 4-6 weeks to return.      9. Post-Op Appointments: Your first post-op appointment will be with Dr. Chayne Baumgart in approximately 2 weeks time.    If you find that they have not been scheduled please call the Orthopaedic Appointment front desk at [phone].   General Anesthesia, Adult, Care After This sheet gives you information about how to care for yourself after your procedure. Your health care provider may also give you more specific instructions. If you have problems or questions, contact your health care provider. What can I expect after the procedure? After the procedure, the following side effects are common:  Pain or discomfort at the IV site.  Nausea.  Vomiting.  Sore throat.  Trouble concentrating.  Feeling cold or chills.  Feeling weak or tired.  Sleepiness and fatigue.  Soreness and body aches. These side effects can affect parts of   the body that were not involved in surgery. Follow these instructions at home: For the time period you were told by your health care provider:  Rest.  Do not participate in activities where you could fall or become injured.  Do not drive or use machinery.  Do not drink alcohol.  Do not take sleeping pills or medicines that cause drowsiness.  Do not make important decisions or sign legal documents.  Do not  take care of children on your own.   Eating and drinking  Follow any instructions from your health care provider about eating or drinking restrictions.  When you feel hungry, start by eating small amounts of foods that are soft and easy to digest (bland), such as toast. Gradually return to your regular diet.  Drink enough fluid to keep your urine pale yellow.  If you vomit, rehydrate by drinking water, juice, or clear broth. General instructions  If you have sleep apnea, surgery and certain medicines can increase your risk for breathing problems. Follow instructions from your health care provider about wearing your sleep device: ? Anytime you are sleeping, including during daytime naps. ? While taking prescription pain medicines, sleeping medicines, or medicines that make you drowsy.  Have a responsible adult stay with you for the time you are told. It is important to have someone help care for you until you are awake and alert.  Return to your normal activities as told by your health care provider. Ask your health care provider what activities are safe for you.  Take over-the-counter and prescription medicines only as told by your health care provider.  If you smoke, do not smoke without supervision.  Keep all follow-up visits as told by your health care provider. This is important. Contact a health care provider if:  You have nausea or vomiting that does not get better with medicine.  You cannot eat or drink without vomiting.  You have pain that does not get better with medicine.  You are unable to pass urine.  You develop a skin rash.  You have a fever.  You have redness around your IV site that gets worse. Get help right away if:  You have difficulty breathing.  You have chest pain.  You have blood in your urine or stool, or you vomit blood. Summary  After the procedure, it is common to have a sore throat or nausea. It is also common to feel tired.  Have a  responsible adult stay with you for the time you are told. It is important to have someone help care for you until you are awake and alert.  When you feel hungry, start by eating small amounts of foods that are soft and easy to digest (bland), such as toast. Gradually return to your regular diet.  Drink enough fluid to keep your urine pale yellow.  Return to your normal activities as told by your health care provider. Ask your health care provider what activities are safe for you. This information is not intended to replace advice given to you by your health care provider. Make sure you discuss any questions you have with your health care provider. Document Revised: 05/17/2020 Document Reviewed: 12/15/2019 Elsevier Patient Education  2021 [employer].  

## 2020-11-02 NOTE — H&P (Signed)
Paper H&P to be scanned into permanent record. H&P reviewed. No significant changes noted.  

## 2020-11-02 NOTE — Op Note (Signed)
Operative Note    SURGERY DATE: 11/02/2020   PRE-OP DIAGNOSIS:  1. Right medial meniscus tear   POST-OP DIAGNOSIS:  1. Right medial meniscus tear   PROCEDURES:  1. Right knee arthroscopy, partial medial meniscectomy   SURGEON: Cato Mulligan, MD   ANESTHESIA: Gen   ESTIMATED BLOOD LOSS: minimal   TOTAL IV FLUIDS: per anesthesia   INDICATION(S):  Heather Hatfield is a 60 y.o. female with signs and symptoms as well as MRI finding of medial meniscus tear after a twisting injury to her knee while walking her dog on 08/29/2020.  She had failed nonoperative management. After discussion of risks, benefits, and alternatives to surgery, the patient elected to proceed.   OPERATIVE FINDINGS:    Examination under anesthesia: A careful examination under anesthesia was performed.  Passive range of motion was: Hyperextension: 1.  Extension: 0.  Flexion: 130.  Lachman: normal. Pivot Shift: normal.  Posterior drawer: normal.  Varus stability in full extension: normal.  Varus stability in 30 degrees of flexion: normal.  Valgus stability in full extension: normal.  Valgus stability in 30 degrees of flexion: normal.   Intra-operative findings: A thorough arthroscopic examination of the knee was performed.  The findings are: 1. Suprapatellar pouch: Normal 2. Undersurface of median ridge:  Focal area approximately 5 x 5 mm of grade 2-3 degenerative changes 3. Medial patellar facet: Grade 1 softening 4. Lateral patellar facet: Grade 1 softening 5. Trochlea: Normal 6. Lateral gutter/popliteus tendon: Normal 7. Hoffa's fat pad: Inflamed 8. Medial gutter/plica: Normal 9. ACL: Normal 10. PCL: Normal 11. Medial meniscus: Radial tear of the posterior horn/body junction involving the entire width of the meniscus with degenerative edges 12. Medial compartment cartilage: Approximately 8 x 15 mm region of grade 2 degenerative changes to the medial femoral condyle; grade 1 changes to the tibial plateau 13. Lateral  meniscus: Normal 14. Lateral compartment cartilage: Grade 1  lateral femoral condyle, focal area of grade 3 degenerative changes to the central tibial plateau measuring approximately 5 x 5 mm   OPERATIVE REPORT:     I identified Heather Hatfield in the pre-operative holding area. I marked the operative knee with my initials. I reviewed the risks and benefits of the proposed surgical intervention and the patient wished to proceed. The patient was transferred to the operative suite and placed in the supine position with all bony prominences padded.  Anesthesia was administered. Appropriate IV antibiotics were administered prior to incision. The extremity was then prepped and draped in standard fashion. A time out was performed confirming the correct extremity, correct patient, and correct procedure.   Arthroscopy portals were marked. Local anesthetic was injected to the planned portal sites. The anterolateral portal was established with an 11 blade.      The arthroscope was placed in the anterolateral portal and then into the suprapatellar pouch. Next, the medial portal was established under needle localization. A diagnostic knee scope was completed with the above findings. The medial meniscus tear was identified.   The MCL was pie-crusted to improve visualization of the posterior horn.  The meniscus tear had degenerative edges.  The meniscal tear was debrided using an arthroscopic biter and an oscillating shaver until the meniscus had stable borders.  The tear was not conducive to repair.  Therefore, the tear pattern necessitated resection of the entire width of the meniscus at the posterior horn/body junction. Arthroscopic fluid was removed from the joint.   The portals were closed with 3-0 Nylon suture. Sterile dressings  included Xeroform, 4x4s, Sof-Rol, and Bias wrap. A Polarcare was placed.  The patient was then awakened and taken to the PACU hemodynamically stable without complication.      POSTOPERATIVE PLAN: The patient will be discharged home today once they meet PACU criteria. Aspirin 325 mg daily was prescribed for 2 weeks for DVT prophylaxis.  Physical therapy will start on POD#3-5. Weight-bearing as tolerated. Follow up in 2 weeks per protocol.

## 2020-11-03 NOTE — Addendum Note (Signed)
Addendum  created 11/03/20 1217 by Veda Canning, MD   Child order released for a procedure order, Order Canceled from Note

## 2020-11-05 ENCOUNTER — Encounter: Payer: Self-pay | Admitting: Orthopedic Surgery

## 2021-01-04 ENCOUNTER — Other Ambulatory Visit: Payer: Self-pay | Admitting: Orthopedic Surgery

## 2021-01-04 ENCOUNTER — Other Ambulatory Visit (HOSPITAL_COMMUNITY): Payer: Self-pay | Admitting: Orthopedic Surgery

## 2021-01-04 DIAGNOSIS — S83221A Peripheral tear of medial meniscus, current injury, right knee, initial encounter: Secondary | ICD-10-CM

## 2021-01-09 ENCOUNTER — Other Ambulatory Visit: Payer: Self-pay

## 2021-01-09 ENCOUNTER — Ambulatory Visit
Admission: RE | Admit: 2021-01-09 | Discharge: 2021-01-09 | Disposition: A | Discharge status: Home / Self Care | Payer: 59 | Source: Ambulatory Visit | Attending: Orthopedic Surgery | Admitting: Orthopedic Surgery

## 2021-01-09 DIAGNOSIS — S83221A Peripheral tear of medial meniscus, current injury, right knee, initial encounter: Secondary | ICD-10-CM | POA: Diagnosis present

## 2021-01-09 IMAGING — MR MR KNEE*R* W/O CM
6 series · 40 of 40 positions shown · non-contrast
Comparison: MRI right knee [DATE].

CLINICAL DATA: Patient status post right knee surgery [DATE].
Pain and improved until 2 weeks ago when the patient suffered onset
severe anterior and medial right knee pain.

EXAM:
MRI OF THE RIGHT KNEE WITHOUT CONTRAST
TECHNIQUE: Multiplanar, multisequence MR imaging of the knee was performed. No
intravenous contrast was administered.

[Series 8: T2 fat-sat · axial · right · 4.0mm · 0.50mm/px · z∈[-75,+49]mm · 5 of 26 slices shown (1 of 3)]
[im 1/26]
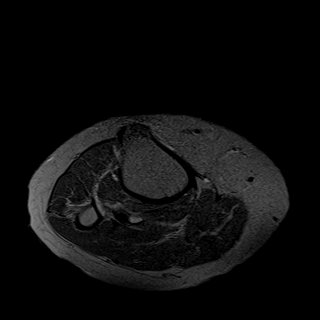
[im 7/26]
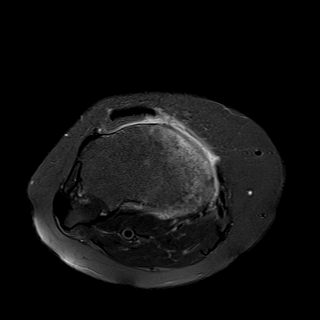
[im 13/26]
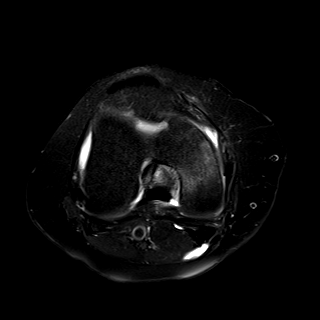
[im 19/26]
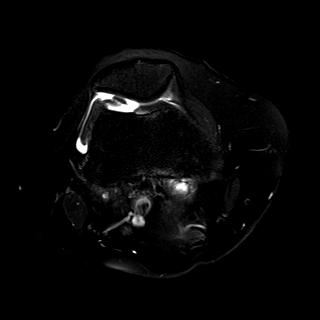
[im 26/26]
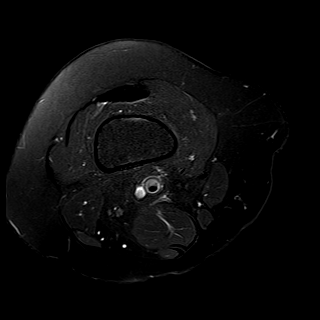

[Series 9: T2 fat-sat · coronal · right · 4.0mm · 0.59mm/px · 6 of 30 slices shown (2 of 3)]
[im 1/30]
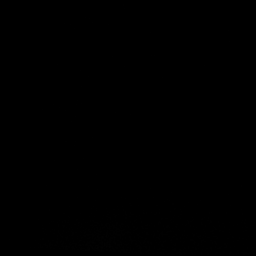
[im 6/30]
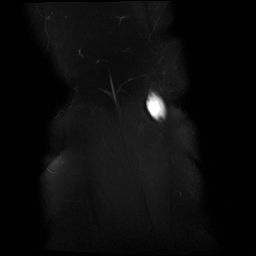
[im 12/30]
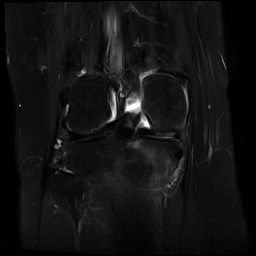
[im 18/30]
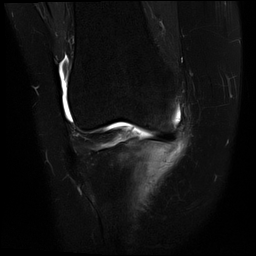
[im 24/30]
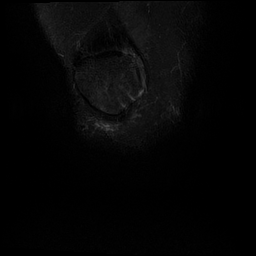
[im 30/30]
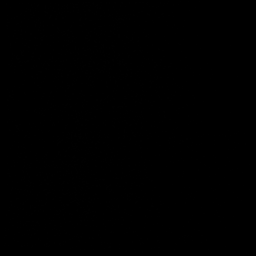

[Series 10: PD fat-sat · sagittal · right · 3.0mm · 0.59mm/px · 7 of 31 slices shown (1 of 2)]
[im 1/31]
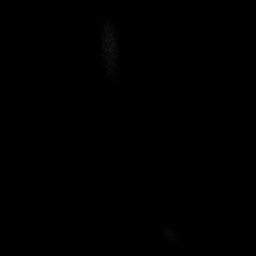
[im 6/31]
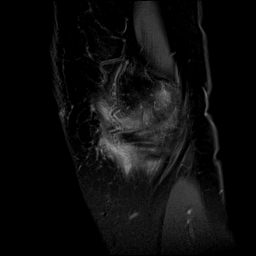
[im 11/31]
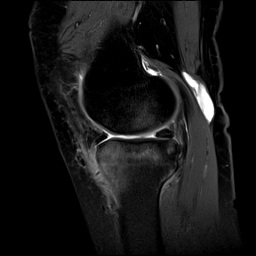
[im 16/31]
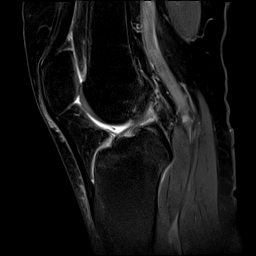
[im 21/31]
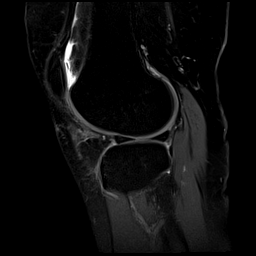
[im 26/31]
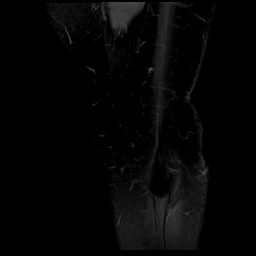
[im 31/31]
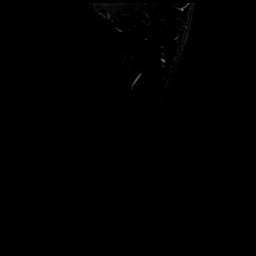

[Series 11: T1 · coronal · right · 4.0mm · 0.59mm/px · 7 of 30 slices shown]
[im 1/30]
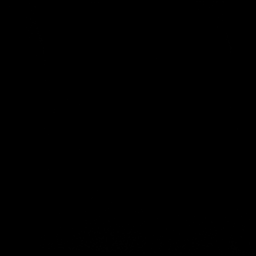
[im 5/30]
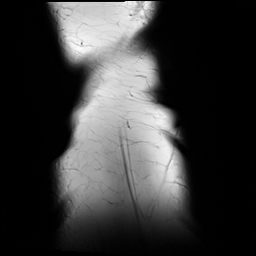
[im 10/30]
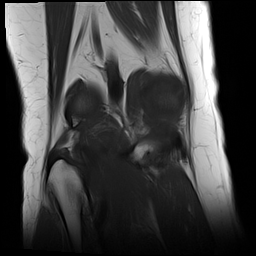
[im 15/30]
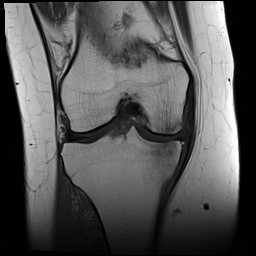
[im 20/30]
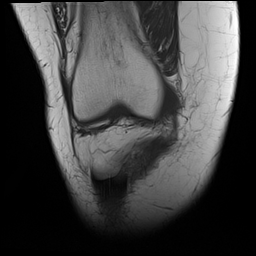
[im 25/30]
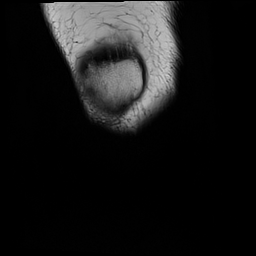
[im 30/30]
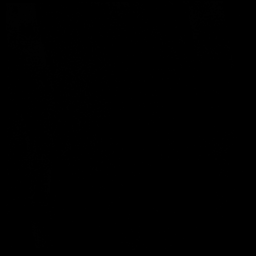

[Series 12: PD fat-sat · coronal · right · 4.0mm · 0.59mm/px · 7 of 30 slices shown (2 of 2)]
[im 1/30]
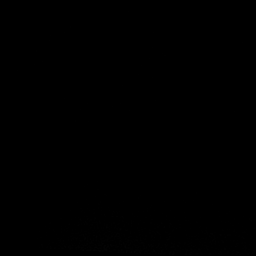
[im 5/30]
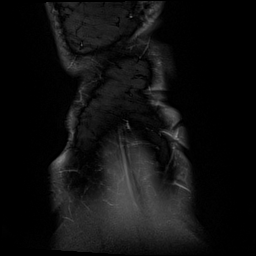
[im 10/30]
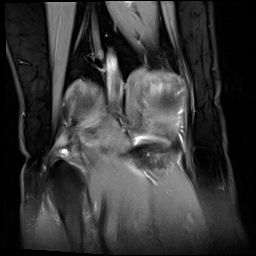
[im 15/30]
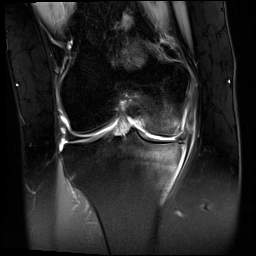
[im 20/30]
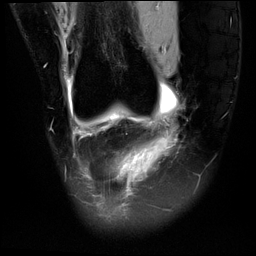
[im 25/30]
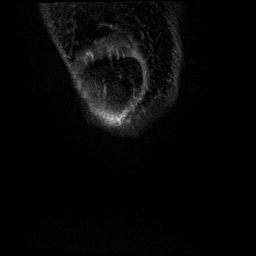
[im 30/30]
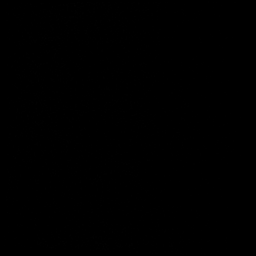

[Series 13: T2 fat-sat · sagittal · right · 3.0mm · 0.59mm/px · 8 of 36 slices shown (3 of 3)]
[im 1/36]
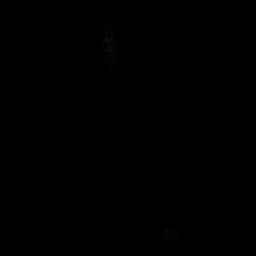
[im 6/36]
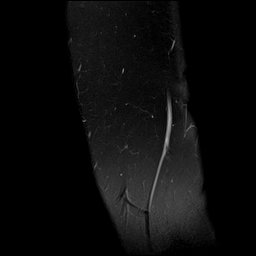
[im 11/36]
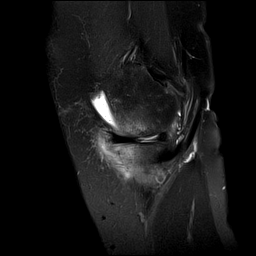
[im 16/36]
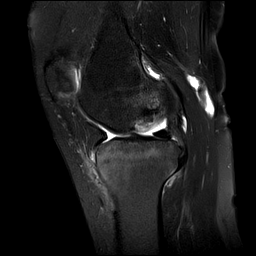
[im 21/36]
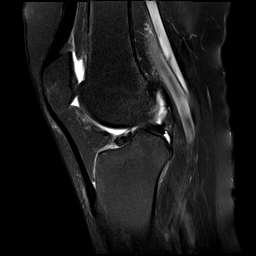
[im 26/36]
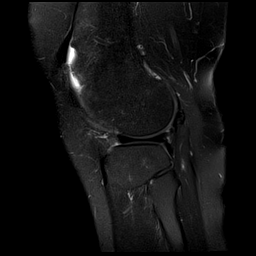
[im 31/36]
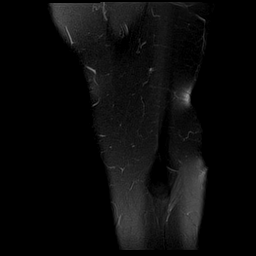
[im 36/36]
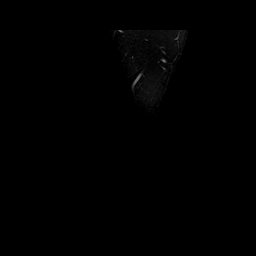

[40 of 40 positions shown; findings below may reference images not displayed]

FINDINGS: MENISCI

Medial meniscus: The patient has undergone debridement a tear of the
posterior horn and posterior body of the medial meniscus with new
blunting along the free edge identified. No tear is identified.

Lateral meniscus:  Intact.

LIGAMENTS

Cruciates:  Intact.

Collaterals:  Intact.

CARTILAGE

Patellofemoral: Small fissure in cartilage along the lateral
patellar facet noted.

Medial: There has been marked progression of cartilage thinning,
particularly along the medial femoral condyle.

Lateral:  Appears normal.

Joint:  Small joint effusion.

Popliteal Fossa: Small Baker's cyst measures 1.2 cm transverse by
0.9 cm AP by approximately 4.6 cm craniocaudal, somewhat increased
compared to the prior study.

Extensor Mechanism:  Intact.

Bones: There is new intense marrow edema in both the medial femoral
condyle and medial tibial plateau. No fracture is identified.

Other: None.
IMPRESSION: New intense marrow edema in the medial femoral condyle and medial
tibia most consistent with stress change. No fracture is seen.

Status post debridement of a tear of the posterior horn and
posterior body of the medial meniscus. Negative for meniscal or
ligament tear.

Marked cartilage thinning about the medial compartment has
progressed since the prior examination and could be due to rapidly
progressive osteoarthritis or postoperative change.

## 2021-11-06 ENCOUNTER — Other Ambulatory Visit: Payer: Self-pay | Admitting: Gastroenterology

## 2021-11-06 DIAGNOSIS — K580 Irritable bowel syndrome with diarrhea: Secondary | ICD-10-CM

## 2021-11-06 DIAGNOSIS — G8929 Other chronic pain: Secondary | ICD-10-CM

## 2021-11-18 ENCOUNTER — Ambulatory Visit: Payer: 59

## 2021-11-25 ENCOUNTER — Ambulatory Visit
Admission: RE | Admit: 2021-11-25 | Discharge: 2021-11-25 | Disposition: A | Discharge status: Home / Self Care | Payer: 59 | Source: Ambulatory Visit | Attending: Gastroenterology | Admitting: Gastroenterology

## 2021-11-25 ENCOUNTER — Other Ambulatory Visit: Payer: Self-pay

## 2021-11-25 DIAGNOSIS — R1031 Right lower quadrant pain: Secondary | ICD-10-CM | POA: Insufficient documentation

## 2021-11-25 DIAGNOSIS — G8929 Other chronic pain: Secondary | ICD-10-CM | POA: Insufficient documentation

## 2021-11-25 DIAGNOSIS — K580 Irritable bowel syndrome with diarrhea: Secondary | ICD-10-CM | POA: Insufficient documentation

## 2021-11-25 LAB — POCT I-STAT CREATININE: Creatinine, Ser: 0.8 mg/dL (ref 0.44–1.00)

## 2021-11-25 MED ORDER — IOHEXOL 300 MG/ML  SOLN
100.0000 mL | Freq: Once | INTRAMUSCULAR | Status: AC | PRN
  Administered 2021-11-25: 100 mL via INTRAVENOUS

## 2022-04-22 ENCOUNTER — Other Ambulatory Visit: Payer: Self-pay | Admitting: Obstetrics and Gynecology

## 2022-06-11 ENCOUNTER — Other Ambulatory Visit: Payer: 59

## 2022-07-14 ENCOUNTER — Other Ambulatory Visit: Payer: 59

## 2022-07-21 NOTE — H&P (Signed)
Heather Hatfield is a 61 y.o. female here for LAVH and BSO for Pelvic Pain (Ultrasound follow up ) .pt with 7 cm fibroid . Repeat u/s still 7 cm . Pt with intermittent  pelvic pain          Past Medical History:  has a past medical history of Depression (08/10/2012) and Nausea and vomiting after administration of anesthetic agent (04/04/2021).  Past Surgical History:  has a past surgical history that includes Knee arthroscopy (Jan 2014); Right knee arthroscopy, partial medial menisectomy (Right, 11/02/2020); and colonoscopy (N/A, 04/04/2021). Family History: family history includes Alcohol abuse in her father; Breast cancer in her maternal grandmother; Colon polyps in her father; Coronary Artery Disease (Blocked arteries around heart) in her maternal grandmother; High blood pressure (Hypertension) in her father; Stroke in her maternal grandfather; Thyroid disease in her mother and sister. Social History:  reports that she has never smoked. She has never used smokeless tobacco. She reports current alcohol use of about 8.0 standard drinks per week. She reports that she does not use drugs. OB/GYN History:  OB History       Gravida  2   Para  2   Term  2   Preterm      AB      Living  2        SAB      IAB      Ectopic      Molar      Multiple      Live Births  2             Allergies: is allergic to adhesive, drug class [propofol analogues], and metronidazole. Medications:   Current Outpatient Medications:    nortriptyline (PAMELOR) 10 MG capsule, Take 1 capsule (10 mg total) by mouth 2 (two) times daily, Disp: 180 capsule, Rfl: 3   Compound Medication, Estriol '1mg'$ /g Use 1 g per vagina 1-2 times weekly Disp 30 g tube with 1 rf, Disp: 1 each, Rfl: 1   Review of Systems: General:                      No fatigue or weight loss Eyes:                           No vision changes Ears:                            No hearing difficulty Respiratory:                No cough or  shortness of breath Pulmonary:                  No asthma or shortness of breath Cardiovascular:           No chest pain, palpitations, dyspnea on exertion Gastrointestinal:          No abdominal bloating, chronic diarrhea, constipations, masses, pain or hematochezia Genitourinary:             No hematuria, dysuria, abnormal vaginal discharge, pelvic pain, Menometrorrhagia+ pelvic pain  Lymphatic:                   No swollen lymph nodes Musculoskeletal:         No muscle weakness Neurologic:  No extremity weakness, syncope, seizure disorder Psychiatric:                  No history of depression, delusions or suicidal/homicidal ideation      Exam:       Vitals:    07/21/22  BP 133/86    Pulse: 86      Body mass index is 23.56 kg/m.   WDWN white/ female in NAD   Lungs: CTA  CV : RRR without murmur    Neck:  no thyromegaly Abdomen: soft , no mass, normal active bowel sounds,  non-tender, no rebound tenderness Pelvic: tanner stage 5 ,  External genitalia: vulva /labia no lesions Urethra: no prolapse Vagina: normal physiologic d/c Cervix: no lesions, no cervical motion tenderness   Uterus:12 weeks in size irregular shape  Adnexa: no mass,  non-tender   Rectovaginal:  U/s today : 1  rt  mid  =  7.82 cm   Endometrium seen with fluid= 5.73 mm   Rt ov  wnl    Lt ov not see Impression:    The primary encounter diagnosis was Pelvic pain. A diagnosis of Intramural leiomyoma of uterus was also pertinent to this visit.       Plan:    LAVH and BSO  .risks of the procedure discussed . See [facility] notes           Caroline Sauger, MD

## 2022-07-23 ENCOUNTER — Ambulatory Visit
Admission: EM | Admit: 2022-07-23 | Discharge: 2022-07-23 | Disposition: A | Discharge status: Home / Self Care | Payer: 59 | Attending: Internal Medicine | Admitting: Internal Medicine

## 2022-07-23 ENCOUNTER — Encounter: Payer: Self-pay | Admitting: Internal Medicine

## 2022-07-23 DIAGNOSIS — R059 Cough, unspecified: Secondary | ICD-10-CM | POA: Diagnosis present

## 2022-07-23 DIAGNOSIS — U071 COVID-19: Secondary | ICD-10-CM | POA: Insufficient documentation

## 2022-07-23 DIAGNOSIS — M545 Low back pain, unspecified: Secondary | ICD-10-CM

## 2022-07-23 DIAGNOSIS — Z8616 Personal history of COVID-19: Secondary | ICD-10-CM

## 2022-07-23 HISTORY — DX: Personal history of COVID-19: Z86.16

## 2022-07-23 LAB — SARS CORONAVIRUS 2 BY RT PCR: SARS Coronavirus 2 by RT PCR: POSITIVE — AB

## 2022-07-23 NOTE — ED Provider Notes (Signed)
[facility]    CSN: [id number] Arrival date & time: 07/23/22  8841      History   Chief Complaint Chief Complaint  Patient presents with   Cough    HPI Heather Hatfield is a 61 y.o. female presents today due to waking up yesterday with cough and back pain. She is supposed to have surgery next week. She did 4 covid test and 2 of them were positive. Started with low back ache 3 days ago, yesterday with rhinitis and only felt she was having a cold. She does not feel back or have aches or HA.     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   Situational stress 07/14/2013   Depression 08/10/2012   Deviated septum 08/10/2012   Left knee injury 08/10/2012   Routine gynecological examination 04/02/2012   Screening for malignant neoplasm of the cervix 04/02/2012    Past Surgical History:  Procedure Laterality Date   dnc  1984   KNEE ARTHROSCOPY     KNEE ARTHROSCOPY WITH MEDIAL MENISECTOMY Right 11/02/2020   Procedure: Right knee arthroscopic partial medial meniscectomy;  Surgeon: Leim Fabry, MD;  Location: [facility];  Service: Orthopedics;  Laterality: Right;  covid + 10-23-20   NOSE SURGERY     rotator cuff surgery Right     OB History     Gravida  3   Para      Term      Preterm      AB  1   Living  2      SAB  1   IAB      Ectopic      Multiple      Live Births               Home Medications    Prior to Admission medications   Medication Sig Start Date End Date Taking? Authorizing Provider  HYDROcodone-acetaminophen (NORCO) 5-325 MG tablet Take 1-2 tablets by mouth every 4 (four) hours as needed for moderate pain or severe pain.  11/02/20   Leim Fabry, MD  nortriptyline (PAMELOR) 10 MG capsule Take 10 mg by mouth 2 (two) times daily.    [provider]  ondansetron (ZOFRAN ODT) 4 MG disintegrating tablet Take 1 tablet (4 mg total) by mouth every 8 (eight) hours as needed for nausea or vomiting. 11/02/20   Leim Fabry, MD    Family History Family History  Problem Relation Age of Onset   Hyperlipidemia Mother    Hyperlipidemia Father    Hypertension Father    Heart disease Father    Arthritis Maternal Grandmother    Cancer Maternal Grandmother    Breast cancer Maternal Grandmother    Hypertension Maternal Grandfather    Stroke Maternal Grandfather     Social History Social History   Tobacco Use   Smoking status: Never   Smokeless tobacco: Never  Vaping Use   Vaping Use: Never used  Substance Use Topics   Alcohol use: Yes    Alcohol/week: 8.0 standard drinks of alcohol    Types: 8 Standard drinks  or equivalent per week   Drug use: No     Allergies   Propofol and Metronidazole   Review of Systems Review of Systems + rhitnis and cough, the rest is neg  Physical Exam Triage Vital Signs ED Triage Vitals  Enc Vitals Group     BP 07/23/22 0951 (!) 157/100     Pulse Rate 07/23/22 0951 97     Resp --      Temp 07/23/22 0951 98.4 F (36.9 C)     Temp Source 07/23/22 0951 Oral     SpO2 07/23/22 0951 97 %     Weight 07/23/22 0950 133 lb (60.3 kg)     Height 07/23/22 0950 '5\' 3"'$  (1.6 m)     Head Circumference --      Peak Flow --      Pain Score 07/23/22 0950 2     Pain Loc --      Pain Edu? --      Excl. in [place]? --    No data found.  Updated Vital Signs BP (!) 157/100 (BP Location: Left Arm)   Pulse 97   Temp 98.4 F (36.9 C) (Oral)   Ht '5\' 3"'$  (1.6 m)   Wt 133 lb (60.3 kg)   LMP 11/16/2013   SpO2 97%   BMI 23.56 kg/m   Visual Acuity Right Eye Distance:   Left Eye Distance:   Bilateral Distance:    Right Eye Near:   Left Eye Near:    Bilateral Near:      Physical  Exam Vitals signs and nursing note reviewed.  Constitutional:      General: She is not in acute distress.    Appearance: Normal appearance. She is not ill-appearing, toxic-appearing or diaphoretic.  HENT:     Head: Normocephalic.     Right Ear: Tympanic membrane, ear canal and external ear normal.     Left Ear: Tympanic membrane, ear canal and external ear normal.     Nose: clear mucous    Mouth/Throat:     Mouth: Mucous membranes are moist.  Eyes:     General: No scleral icterus.       Right eye: No discharge.        Left eye: No discharge.     Conjunctiva/sclera: Conjunctivae normal.  Neck:     Musculoskeletal: Neck supple. No neck rigidity.  Cardiovascular:     Rate and Rhythm: Normal rate and regular rhythm.     Heart sounds: No murmur.  Pulmonary:     Effort: Pulmonary effort is normal.     Breath sounds: Normal breath sounds.   Musculoskeletal: Normal range of motion. Has mild tenderness on lower central lumbar region with palpation. ROM is normal.  Lymphadenopathy:     Cervical: No cervical adenopathy.  Skin:    General: Skin is warm and dry.     Coloration: Skin is not jaundiced.     Findings: No rash.  Neurological:     Mental Status: She is alert and oriented to person, place, and time.     Gait: Gait normal.  Psychiatric:        Mood and Affect: Mood normal.        Behavior: Behavior normal.        Thought Content: Thought content normal.        Judgment: Judgment normal.    UC Treatments / Results  Labs (all labs ordered are listed, but only abnormal results are displayed) Labs Reviewed  SARS CORONAVIRUS 2 BY RT PCR - Abnormal; Notable for the following components:      Result Value   SARS Coronavirus 2 by RT PCR POSITIVE (*)    All other components within normal limits    EKG   Radiology No results found.  Procedures Procedures (including critical care time)  Medications Ordered in UC Medications - No data to display  Initial Impression /  Assessment and Plan / UC Course  I have reviewed the triage vital signs and the nursing notes.  Pertinent labs results that were available during my care of the patient were reviewed by me and considered in my medical decision making (see chart for details).   Covid infection  Pt was called with results and she declined antiviral since she does not feel bad.  Final Clinical Impressions(s) / UC Diagnoses   Final diagnoses:  Cough, unspecified type  Acute low back pain without sciatica, unspecified back pain laterality     Discharge Instructions      I will call you when the covid test is done     ED Prescriptions   None    PDMP not reviewed this encounter.   Shelby Mattocks, [place] 07/23/22 1208

## 2022-07-23 NOTE — Discharge Instructions (Signed)
I will call you when the covid test is done

## 2022-07-23 NOTE — ED Triage Notes (Signed)
Pt states she has surgery next week, woke up yesterday morning back ache, cough.

## 2022-07-24 ENCOUNTER — Inpatient Hospital Stay: Admission: RE | Admit: 2022-07-24 | Payer: 59 | Source: Ambulatory Visit

## 2022-07-31 DIAGNOSIS — Z01818 Encounter for other preprocedural examination: Secondary | ICD-10-CM

## 2022-08-14 NOTE — H&P (Signed)
Heather Hatfield is a 61 y.o. female here for LAVH and BSO for Pelvic Pain (Ultrasound follow up ) .pt with 7 cm fibroid . Repeat u/s still 7 cm . Pt with intermittent  pelvic pain          Past Medical History:  has a past medical history of Depression (08/10/2012) and Nausea and vomiting after administration of anesthetic agent (04/04/2021).  Past Surgical History:  has a past surgical history that includes Knee arthroscopy (Jan 2014); Right knee arthroscopy, partial medial menisectomy (Right, 11/02/2020); and colonoscopy (N/A, 04/04/2021). Family History: family history includes Alcohol abuse in her father; Breast cancer in her maternal grandmother; Colon polyps in her father; Coronary Artery Disease (Blocked arteries around heart) in her maternal grandmother; High blood pressure (Hypertension) in her father; Stroke in her maternal grandfather; Thyroid disease in her mother and sister. Social History:  reports that she has never smoked. She has never used smokeless tobacco. She reports current alcohol use of about 8.0 standard drinks per week. She reports that she does not use drugs. OB/GYN History:  OB History       Gravida  2   Para  2   Term  2   Preterm      AB      Living  2        SAB      IAB      Ectopic      Molar      Multiple      Live Births  2             Allergies: is allergic to adhesive, drug class [propofol analogues], and metronidazole. Medications:   Current Outpatient Medications:    nortriptyline (PAMELOR) 10 MG capsule, Take 1 capsule (10 mg total) by mouth 2 (two) times daily, Disp: 180 capsule, Rfl: 3   Compound Medication, Estriol '1mg'$ /g Use 1 g per vagina 1-2 times weekly Disp 30 g tube with 1 rf, Disp: 1 each, Rfl: 1   Review of Systems: General:                      No fatigue or weight loss Eyes:                           No vision changes Ears:                            No hearing difficulty Respiratory:                No cough or  shortness of breath Pulmonary:                  No asthma or shortness of breath Cardiovascular:           No chest pain, palpitations, dyspnea on exertion Gastrointestinal:          No abdominal bloating, chronic diarrhea, constipations, masses, pain or hematochezia Genitourinary:             No hematuria, dysuria, abnormal vaginal discharge, pelvic pain, Menometrorrhagia+ pelvic pain  Lymphatic:                   No swollen lymph nodes Musculoskeletal:         No muscle weakness Neurologic:  No extremity weakness, syncope, seizure disorder Psychiatric:                  No history of depression, delusions or suicidal/homicidal ideation      Exam:         Vitals:  08/14/22   BP 133/86     Pulse: 86      Body mass index is 23.56 kg/m.   WDWN white/ female in NAD   Lungs: CTA  CV : RRR without murmur     Neck:  no thyromegaly Abdomen: soft , no mass, normal active bowel sounds,  non-tender, no rebound tenderness Pelvic: tanner stage 5 ,  External genitalia: vulva /labia no lesions Urethra: no prolapse Vagina: normal physiologic d/c Cervix: no lesions, no cervical motion tenderness   Uterus:12 weeks in size irregular shape  Adnexa: no mass,  non-tender   Rectovaginal:  U/s today : 1  rt  mid  =  7.82 cm   Endometrium seen with fluid= 5.73 mm   Rt ov  wnl    Lt ov not see Impression:    The primary encounter diagnosis was Pelvic pain. A diagnosis of Intramural leiomyoma of uterus was also pertinent to this visit.       Plan:    LAVH and BSO  .risks of the procedure discussed . See [facility] notes

## 2022-08-20 ENCOUNTER — Other Ambulatory Visit: Payer: Self-pay

## 2022-08-20 ENCOUNTER — Encounter
Admission: RE | Admit: 2022-08-20 | Discharge: 2022-08-20 | Disposition: A | Discharge status: Home / Self Care | Payer: 59 | Source: Ambulatory Visit | Attending: Obstetrics and Gynecology | Admitting: Obstetrics and Gynecology

## 2022-08-20 HISTORY — DX: Other specified postprocedural states: Z98.890

## 2022-08-20 HISTORY — DX: Angina pectoris, unspecified: I20.9

## 2022-08-20 HISTORY — DX: Adjustment disorder with depressed mood: F43.21

## 2022-08-20 NOTE — Patient Instructions (Addendum)
Your procedure is scheduled on: 08/27/22 - Wednesday Report to the Registration Desk on the 1st floor of the [facility]. To find out your arrival time, please call [phone] between 1PM - 3PM on: 08/26/22 - Tuesday If your arrival time is 6:00 am, do not arrive prior to that time as the [place] entrance doors do not open until 6:00 am.  REMEMBER: Instructions that are not followed completely may result in serious medical risk, up to and including death; or upon the discretion of your surgeon and anesthesiologist your surgery may need to be rescheduled.  Do not eat food after midnight the night before surgery.  No gum chewing, lozengers or hard candies.  You may however, drink CLEAR liquids up to 2 hours before you are scheduled to arrive for your surgery. Do not drink anything within 2 hours of your scheduled arrival time.  Clear liquids include: - water  - apple juice without pulp - gatorade (not RED colors) - black coffee or tea (Do NOT add milk or creamers to the coffee or tea) Do NOT drink anything that is not on this list.  TAKE THESE MEDICATIONS THE MORNING OF SURGERY WITH A SIP OF WATER: NONE  One week prior to surgery: Stop Anti-inflammatories (NSAIDS) such as Advil, Aleve, Ibuprofen, Motrin, Naproxen, Naprosyn and Aspirin based products such as Excedrin, Goodys Powder, BC Powder.  Stop ANY OVER THE COUNTER supplements until after surgery.  You may  take Tylenol if needed for pain up until the day of surgery.  No Alcohol for 24 hours before or after surgery.  No Smoking including e-cigarettes for 24 hours prior to surgery.  No chewable tobacco products for at least 6 hours prior to surgery.  No nicotine patches on the day of surgery.  Do not use any "recreational" drugs for at least a week prior to your surgery.  Please be advised that the combination of cocaine and anesthesia may have negative outcomes, up to and including death. If you test positive for  cocaine, your surgery will be cancelled.  On the morning of surgery brush your teeth with toothpaste and water, you may rinse your mouth with mouthwash if you wish. Do not swallow any toothpaste or mouthwash.  Use CHG Soap or wipes as directed on instruction sheet.  Do not wear jewelry, make-up, hairpins, clips or nail polish.  Do not wear lotions, powders, or perfumes.   Do not shave body from the neck down 48 hours prior to surgery just in case you cut yourself which could leave a site for infection.  Also, freshly shaved skin may become irritated if using the CHG soap.  Contact lenses, hearing aids and dentures may not be worn into surgery.  Do not bring valuables to the hospital. [facility] is not responsible for any missing/lost belongings or valuables.   Notify your doctor if there is any change in your medical condition (cold, fever, infection).  Wear comfortable clothing (specific to your surgery type) to the hospital.  After surgery, you can help prevent lung complications by doing breathing exercises.  Take deep breaths and cough every 1-2 hours. Your doctor may order a device called an Incentive Spirometer to help you take deep breaths. When coughing or sneezing, hold a pillow firmly against your incision with both hands. This is called "splinting." Doing this helps protect your incision. It also decreases belly discomfort.  If you are being admitted to the hospital overnight, leave your suitcase in the car. After surgery  it may be brought to your room.  If you are being discharged the day of surgery, you will not be allowed to drive home. You will need a responsible adult (18 years or older) to drive you home and stay with you that night.   If you are taking public transportation, you will need to have a responsible adult (18 years or older) with you. Please confirm with your physician that it is acceptable to use public transportation.   Please call the [place] Dept. at [phone] if you have any questions about these instructions.  Surgery Visitation Policy:  Patients undergoing a surgery or procedure may have two family members or support persons with them as long as the person is not COVID-19 positive or experiencing its symptoms.   Inpatient Visitation:    Visiting hours are 7 a.m. to 8 p.m. Up to four visitors are allowed at one time in a patient room. The visitors may rotate out with other people during the day. One designated support person (adult) may remain overnight.  MASKING: Due to an increase in RSV rates and hospitalizations, in-patient care areas in which we serve newborns, infants and children, masks will be required for teammates and visitors.  Children ages 31 and under may not visit. This policy affects the following departments only:  [place] Postpartum area Mother Baby Unit Newborn nursery/Special care nursery  Other areas: Masks continue to be strongly recommended for [place] teammates, visitors and patients in all other areas. Visitation is not restricted outside of the units listed above.

## 2022-08-21 ENCOUNTER — Encounter: Payer: Self-pay | Admitting: Urgent Care

## 2022-08-21 ENCOUNTER — Encounter
Admission: RE | Admit: 2022-08-21 | Discharge: 2022-08-21 | Disposition: A | Discharge status: Home / Self Care | Payer: 59 | Source: Ambulatory Visit | Attending: Obstetrics and Gynecology | Admitting: Obstetrics and Gynecology

## 2022-08-21 DIAGNOSIS — Z01812 Encounter for preprocedural laboratory examination: Secondary | ICD-10-CM | POA: Diagnosis present

## 2022-08-21 DIAGNOSIS — Z01818 Encounter for other preprocedural examination: Secondary | ICD-10-CM

## 2022-08-21 LAB — CBC
HCT: 42 % (ref 36.0–46.0)
Hemoglobin: 14.7 g/dL (ref 12.0–15.0)
MCH: 31.3 pg (ref 26.0–34.0)
MCHC: 35 g/dL (ref 30.0–36.0)
MCV: 89.6 fL (ref 80.0–100.0)
Platelets: 367 10*3/uL (ref 150–400)
RBC: 4.69 MIL/uL (ref 3.87–5.11)
RDW: 12.5 % (ref 11.5–15.5)
WBC: 5.8 10*3/uL (ref 4.0–10.5)
nRBC: 0 % (ref 0.0–0.2)

## 2022-08-21 LAB — BASIC METABOLIC PANEL
Anion gap: 6 (ref 5–15)
BUN: 20 mg/dL (ref 8–23)
CO2: 27 mmol/L (ref 22–32)
Calcium: 9.5 mg/dL (ref 8.9–10.3)
Chloride: 103 mmol/L (ref 98–111)
Creatinine, Ser: 0.71 mg/dL (ref 0.44–1.00)
GFR, Estimated: >60 mL/min (ref >60)
Glucose, Bld: 108 mg/dL — ABNORMAL HIGH (ref 70–99)
Potassium: 4 mmol/L (ref 3.5–5.1)
Sodium: 136 mmol/L (ref 135–145)

## 2022-08-21 LAB — TYPE AND SCREEN
ABO/RH(D): B POS
Antibody Screen: NEGATIVE

## 2022-08-27 ENCOUNTER — Other Ambulatory Visit: Payer: Self-pay

## 2022-08-27 ENCOUNTER — Encounter
Admission: RE | Disposition: A | Discharge status: Home / Self Care | Payer: Self-pay | Source: Home / Self Care | Attending: Obstetrics and Gynecology

## 2022-08-27 ENCOUNTER — Ambulatory Visit: Payer: 59 | Admitting: Certified Registered"

## 2022-08-27 ENCOUNTER — Encounter: Payer: Self-pay | Admitting: Obstetrics and Gynecology

## 2022-08-27 ENCOUNTER — Ambulatory Visit
Admission: RE | Admit: 2022-08-27 | Discharge: 2022-08-27 | Disposition: A | Discharge status: Home / Self Care | Payer: 59 | Attending: Obstetrics and Gynecology | Admitting: Obstetrics and Gynecology

## 2022-08-27 ENCOUNTER — Ambulatory Visit: Payer: 59 | Admitting: Urgent Care

## 2022-08-27 DIAGNOSIS — Z8616 Personal history of COVID-19: Secondary | ICD-10-CM | POA: Diagnosis not present

## 2022-08-27 DIAGNOSIS — D251 Intramural leiomyoma of uterus: Secondary | ICD-10-CM | POA: Diagnosis not present

## 2022-08-27 DIAGNOSIS — Z86018 Personal history of other benign neoplasm: Secondary | ICD-10-CM | POA: Insufficient documentation

## 2022-08-27 DIAGNOSIS — N72 Inflammatory disease of cervix uteri: Secondary | ICD-10-CM | POA: Diagnosis not present

## 2022-08-27 DIAGNOSIS — N83201 Unspecified ovarian cyst, right side: Secondary | ICD-10-CM | POA: Diagnosis not present

## 2022-08-27 DIAGNOSIS — G8929 Other chronic pain: Secondary | ICD-10-CM | POA: Insufficient documentation

## 2022-08-27 DIAGNOSIS — Z01818 Encounter for other preprocedural examination: Secondary | ICD-10-CM

## 2022-08-27 HISTORY — PX: LAPAROSCOPIC VAGINAL HYSTERECTOMY WITH SALPINGO OOPHORECTOMY: SHX6681

## 2022-08-27 LAB — ABO/RH: ABO/RH(D): B POS

## 2022-08-27 SURGERY — HYSTERECTOMY, VAGINAL, LAPAROSCOPY-ASSISTED, WITH SALPINGO-OOPHORECTOMY
Anesthesia: General | Site: Abdomen | Laterality: Bilateral

## 2022-08-27 MED ORDER — LIDOCAINE HCL (CARDIAC) PF 100 MG/5ML IV SOSY
PREFILLED_SYRINGE | INTRAVENOUS | Status: DC | PRN
  Administered 2022-08-27: 60 mg via INTRAVENOUS

## 2022-08-27 MED ORDER — CHLORHEXIDINE GLUCONATE 0.12 % MT SOLN
15.0000 mL | Freq: Once | OROMUCOSAL | Status: AC
  Administered 2022-08-27: 15 mL via OROMUCOSAL

## 2022-08-27 MED ORDER — LIDOCAINE-EPINEPHRINE 1 %-1:100000 IJ SOLN
INTRAMUSCULAR | Status: AC
  Filled 2022-08-27: qty 1

## 2022-08-27 MED ORDER — LACTATED RINGERS IV SOLN: INTRAVENOUS | Status: DC

## 2022-08-27 MED ORDER — FAMOTIDINE 20 MG PO TABS
20.0000 mg | ORAL_TABLET | Freq: Once | ORAL | Status: AC
  Administered 2022-08-27: 20 mg via ORAL

## 2022-08-27 MED ORDER — LIDOCAINE-EPINEPHRINE 1 %-1:100000 IJ SOLN
INTRAMUSCULAR | Status: DC | PRN
  Administered 2022-08-27: 10 mL

## 2022-08-27 MED ORDER — FAMOTIDINE 20 MG PO TABS
ORAL_TABLET | ORAL | Status: AC
  Filled 2022-08-27: qty 1

## 2022-08-27 MED ORDER — CEFAZOLIN SODIUM-DEXTROSE 2-4 GM/100ML-% IV SOLN
INTRAVENOUS | Status: AC
  Filled 2022-08-27: qty 100

## 2022-08-27 MED ORDER — ONDANSETRON HCL 4 MG PO TABS: 8.0000 mg | ORAL_TABLET | Freq: Three times a day (TID) | ORAL | Status: DC | PRN

## 2022-08-27 MED ORDER — ACETAMINOPHEN 500 MG PO TABS
ORAL_TABLET | ORAL | Status: AC
  Filled 2022-08-27: qty 2

## 2022-08-27 MED ORDER — 0.9 % SODIUM CHLORIDE (POUR BTL) OPTIME
TOPICAL | Status: DC | PRN
  Administered 2022-08-27 (×2): 500 mL

## 2022-08-27 MED ORDER — FENTANYL CITRATE (PF) 100 MCG/2ML IJ SOLN: 25.0000 ug | INTRAMUSCULAR | Status: DC | PRN

## 2022-08-27 MED ORDER — GABAPENTIN 300 MG PO CAPS
ORAL_CAPSULE | ORAL | Status: AC
  Filled 2022-08-27: qty 1

## 2022-08-27 MED ORDER — BUPIVACAINE HCL (PF) 0.5 % IJ SOLN
INTRAMUSCULAR | Status: AC
  Filled 2022-08-27: qty 30

## 2022-08-27 MED ORDER — MIDAZOLAM HCL 2 MG/2ML IJ SOLN
INTRAMUSCULAR | Status: AC
  Filled 2022-08-27: qty 2

## 2022-08-27 MED ORDER — ORAL CARE MOUTH RINSE: 15.0000 mL | Freq: Once | OROMUCOSAL | Status: AC

## 2022-08-27 MED ORDER — FENTANYL CITRATE (PF) 100 MCG/2ML IJ SOLN
INTRAMUSCULAR | Status: AC
  Filled 2022-08-27: qty 2

## 2022-08-27 MED ORDER — CHLORHEXIDINE GLUCONATE 0.12 % MT SOLN
OROMUCOSAL | Status: AC
  Filled 2022-08-27: qty 15

## 2022-08-27 MED ORDER — OXYCODONE HCL 5 MG PO TABS
5.0000 mg | ORAL_TABLET | ORAL | Status: DC | PRN
  Administered 2022-08-27: 5 mg via ORAL

## 2022-08-27 MED ORDER — ONDANSETRON HCL 4 MG/2ML IJ SOLN
INTRAMUSCULAR | Status: DC | PRN
  Administered 2022-08-27: 4 mg via INTRAVENOUS

## 2022-08-27 MED ORDER — ACETAMINOPHEN 500 MG PO TABS
1000.0000 mg | ORAL_TABLET | ORAL | Status: AC
  Administered 2022-08-27: 1000 mg via ORAL

## 2022-08-27 MED ORDER — DEXAMETHASONE SODIUM PHOSPHATE 10 MG/ML IJ SOLN
INTRAMUSCULAR | Status: DC | PRN
  Administered 2022-08-27: 10 mg via INTRAVENOUS

## 2022-08-27 MED ORDER — PROPOFOL 10 MG/ML IV BOLUS
INTRAVENOUS | Status: AC
  Filled 2022-08-27: qty 40

## 2022-08-27 MED ORDER — DEXMEDETOMIDINE HCL IN NACL 80 MCG/20ML IV SOLN
INTRAVENOUS | Status: DC | PRN
  Administered 2022-08-27: 8 ug via BUCCAL

## 2022-08-27 MED ORDER — BUPIVACAINE HCL 0.5 % IJ SOLN
INTRAMUSCULAR | Status: DC | PRN
  Administered 2022-08-27: 10 mL

## 2022-08-27 MED ORDER — MIDAZOLAM HCL 2 MG/2ML IJ SOLN
INTRAMUSCULAR | Status: DC | PRN
  Administered 2022-08-27: 2 mg via INTRAVENOUS

## 2022-08-27 MED ORDER — FENTANYL CITRATE (PF) 100 MCG/2ML IJ SOLN
INTRAMUSCULAR | Status: DC | PRN
  Administered 2022-08-27 (×2): 50 ug via INTRAVENOUS

## 2022-08-27 MED ORDER — OXYCODONE HCL 5 MG PO TABS
ORAL_TABLET | ORAL | Status: AC
  Filled 2022-08-27: qty 1

## 2022-08-27 MED ORDER — ROCURONIUM BROMIDE 100 MG/10ML IV SOLN
INTRAVENOUS | Status: DC | PRN
  Administered 2022-08-27: 10 mg via INTRAVENOUS
  Administered 2022-08-27: 50 mg via INTRAVENOUS
  Administered 2022-08-27: 20 mg via INTRAVENOUS

## 2022-08-27 MED ORDER — GABAPENTIN 300 MG PO CAPS
300.0000 mg | ORAL_CAPSULE | ORAL | Status: AC
  Administered 2022-08-27: 300 mg via ORAL

## 2022-08-27 MED ORDER — PROPOFOL 10 MG/ML IV BOLUS
INTRAVENOUS | Status: DC | PRN
  Administered 2022-08-27: 150 mg via INTRAVENOUS

## 2022-08-27 MED ORDER — POVIDONE-IODINE 10 % EX SWAB
2.0000 | Freq: Once | CUTANEOUS | Status: AC
  Administered 2022-08-27: 2 via TOPICAL

## 2022-08-27 MED ORDER — CEFAZOLIN SODIUM-DEXTROSE 2-4 GM/100ML-% IV SOLN
2.0000 g | Freq: Once | INTRAVENOUS | Status: AC
Start: 2022-08-27 — End: 2022-08-27
  Administered 2022-08-27: 2 g via INTRAVENOUS

## 2022-08-27 MED ORDER — PROMETHAZINE HCL 25 MG/ML IJ SOLN: 6.2500 mg | INTRAMUSCULAR | Status: DC | PRN

## 2022-08-27 MED ORDER — SUGAMMADEX SODIUM 200 MG/2ML IV SOLN
INTRAVENOUS | Status: DC | PRN
  Administered 2022-08-27: 200 mg via INTRAVENOUS

## 2022-08-27 MED ORDER — KETOROLAC TROMETHAMINE 30 MG/ML IJ SOLN
INTRAMUSCULAR | Status: DC | PRN
  Administered 2022-08-27: 30 mg via INTRAVENOUS

## 2022-08-27 SURGICAL SUPPLY — 53 items
APPLICATOR ARISTA FLEXITIP XL (MISCELLANEOUS) IMPLANT
BAG URINE DRAIN 2000ML AR STRL (UROLOGICAL SUPPLIES) ×1 IMPLANT
BLADE SURG SZ11 CARB STEEL (BLADE) ×1 IMPLANT
CATH FOLEY 2WAY  5CC 16FR (CATHETERS) ×1
CATH ROBINSON RED A/P 16FR (CATHETERS) ×1 IMPLANT
CATH URTH 16FR FL 2W BLN LF (CATHETERS) ×1 IMPLANT
CHLORAPREP W/TINT 26 (MISCELLANEOUS) ×1 IMPLANT
DRAPE SURG 17X11 SM STRL (DRAPES) ×1 IMPLANT
DRSG TEGADERM 2-3/8X2-3/4 SM (GAUZE/BANDAGES/DRESSINGS) ×4 IMPLANT
ELECT REM PT RETURN 9FT ADLT (ELECTROSURGICAL) ×1
ELECTRODE REM PT RTRN 9FT ADLT (ELECTROSURGICAL) ×1 IMPLANT
GAUZE 4X4 16PLY ~~LOC~~+RFID DBL (SPONGE) ×3 IMPLANT
GLOVE SURG SYN 8.0 (GLOVE) ×3 IMPLANT
GLOVE SURG SYN 8.0 PF PI (GLOVE) ×3 IMPLANT
GOWN STRL REUS W/ TWL LRG LVL3 (GOWN DISPOSABLE) ×2 IMPLANT
GOWN STRL REUS W/ TWL XL LVL3 (GOWN DISPOSABLE) ×3 IMPLANT
GOWN STRL REUS W/TWL LRG LVL3 (GOWN DISPOSABLE) ×2
GOWN STRL REUS W/TWL XL LVL3 (GOWN DISPOSABLE) ×3
GRASPER SUT TROCAR 14GX15 (MISCELLANEOUS) IMPLANT
HEMOSTAT ARISTA ABSORB 3G PWDR (HEMOSTASIS) IMPLANT
IRRIGATION STRYKERFLOW (MISCELLANEOUS) ×1 IMPLANT
IRRIGATOR STRYKERFLOW (MISCELLANEOUS) ×1
IV LACTATED RINGERS 1000ML (IV SOLUTION) ×1 IMPLANT
KIT PINK PAD W/HEAD ARE REST (MISCELLANEOUS) ×1
KIT PINK PAD W/HEAD ARM REST (MISCELLANEOUS) ×1 IMPLANT
KIT TURNOVER CYSTO (KITS) ×1 IMPLANT
LABEL OR SOLS (LABEL) ×1 IMPLANT
MANIFOLD NEPTUNE II (INSTRUMENTS) ×1 IMPLANT
NEEDLE HYPO 22GX1.5 SAFETY (NEEDLE) ×1 IMPLANT
PACK BASIN MINOR ARMC (MISCELLANEOUS) ×1 IMPLANT
PACK GYN LAPAROSCOPIC (MISCELLANEOUS) ×1 IMPLANT
PAD OB MATERNITY 4.3X12.25 (PERSONAL CARE ITEMS) ×1 IMPLANT
SCRUB CHG 4% DYNA-HEX 4OZ (MISCELLANEOUS) ×1 IMPLANT
SHEARS HARMONIC ACE PLUS 36CM (ENDOMECHANICALS) IMPLANT
SLEEVE Z-THREAD 5X100MM (TROCAR) ×2 IMPLANT
SPONGE GAUZE 2X2 8PLY STRL LF (GAUZE/BANDAGES/DRESSINGS) ×3 IMPLANT
STRIP CLOSURE SKIN 1/2X4 (GAUZE/BANDAGES/DRESSINGS) ×1 IMPLANT
SUT PDS 2-0 27IN (SUTURE) IMPLANT
SUT VIC AB 0 CT1 27 (SUTURE) ×2
SUT VIC AB 0 CT1 27XCR 8 STRN (SUTURE) ×2 IMPLANT
SUT VIC AB 0 CT1 36 (SUTURE) ×1 IMPLANT
SUT VIC AB 0 CT2 27 (SUTURE) IMPLANT
SUT VIC AB 2-0 SH 27 (SUTURE) ×1
SUT VIC AB 2-0 SH 27XBRD (SUTURE) ×1 IMPLANT
SUT VIC AB 2-0 UR6 27 (SUTURE) IMPLANT
SUT VIC AB 4-0 SH 27 (SUTURE) ×1
SUT VIC AB 4-0 SH 27XANBCTRL (SUTURE) ×1 IMPLANT
SYR 10ML LL (SYRINGE) ×1 IMPLANT
SYR CONTROL 10ML LL (SYRINGE) ×1 IMPLANT
TRAP FLUID SMOKE EVACUATOR (MISCELLANEOUS) ×1 IMPLANT
TROCAR XCEL NON-BLD 5MMX100MML (ENDOMECHANICALS) ×1 IMPLANT
TUBING EVAC SMOKE HEATED PNEUM (TUBING) ×1 IMPLANT
WATER STERILE IRR 500ML POUR (IV SOLUTION) ×1 IMPLANT

## 2022-08-27 NOTE — Op Note (Signed)
Heather Hatfield, Heather Hatfield MEDICAL RECORD NO: [id number] ACCOUNT NO: [id number] DATE OF BIRTH: 1961/05/28 FACILITY: [facility] LOCATION: [facility] PHYSICIAN: Boykin Nearing, MD  Operative Report   DATE OF PROCEDURE: 08/27/2022  PREOPERATIVE DIAGNOSES: 1.  Chronic pelvic pain. 2.  Fibroid uterus.  POSTOPERATIVE DIAGNOSES: 1.  Chronic pelvic pain. 2.  Fibroid uterus.  PROCEDURE: 1.  Laparoscopic-assisted vaginal hysterectomy. 2.  Bilateral salpingo-oophorectomy.  SURGEON:  Boykin Nearing, MD  FIRST ASSISTANT:  Donneta Romberg, MD  ANESTHESIA:  General endotracheal anesthesia.  INDICATIONS:  61 year old female with a history of chronic pelvic pain and ultrasound demonstrating a 7 cm intrauterine fibroid prominent on the right side of the uterus.  DESCRIPTION OF PROCEDURE:  After adequate general endotracheal anesthesia, the patient was placed in the dorsal supine position with the legs in the Courtenay stirrups.  The patient's abdomen, perineum and vagina were prepped and draped in normal sterile  fashion.  Timeout was performed.  The patient did receive 2 grams of IV Ancef prior to commencement of the case.  A speculum was placed into the vagina and the anterior cervix was grasped with a single tooth tenaculum and a Cohen cannula was placed as  the uterine manipulator for the procedure.  Foley catheter was placed for this part of the procedure as well.  Gloves and gown were changed and attention was directed to the patient's abdomen where a 5 mm infraumbilical incision was made after injection  with 0.5% Marcaine.  A 5 mm laparoscope was advanced into the abdominal cavity under direct visualization with the Optiview cannula.  The patient's abdomen was insufflated.  A second port placement was placed left lower quadrant, 3 cm medial to the left  anterior iliac spine and under direct visualization, 5 mm trocar was advanced.  Likewise on the right side a 5 mm trocar was advanced 3 cm  medial to the right anterior iliac spine under direct visualization.  Initial impression revealed a 12 week uterus  bulbous more prominent on the right side of the uterus.  Ureters were identified bilaterally with normal peristaltic activity.  The left fallopian tube was then grasped and the left infundibulopelvic ligament was cauterized with the Kleppinger cautery  and the Harmonic scalpel was used to dissect the ovary and fallopian tube free.  The round ligament on the left side and broad ligament was incised to the level of the left uterine artery.  A bladder flap was created with the Harmonic scalpel.  The left  uterine artery was cauterized with the Kleppinger cautery and transected with the Harmonic scalpel.  Similar procedure was repeated on the right side.  Again, the right infundibulopelvic ligament was cauterized and transected followed by transecting the  right round ligaments and dissection of the broad ligaments to the level of the right uterine artery.  The right uterine artery was cauterized and transected down with the Harmonic scalpel.  Attention was then directed vaginally, legs were brought up and  the Foley catheter was removed as well as the Cohen cannula and the single tooth tenaculum.  Thyroid tenacula were placed on the anterior and posterior lip of the cervix.  The cervix was circumferentially injected with 1% lidocaine with 1:100,000  epinephrine.  A direct posterior colpotomy incision was made.  Upon entry into the posterior cul-de-sac long weighted speculum was placed.  The uterosacral ligaments were then bilaterally clamped, transected and suture ligated with 0 Vicryl suture.  Two  additional clamps on each anterior cervix was circumferentially incised with the  Bovie and the anterior cul-de-sac was entered without difficulty.  Cardinal ligaments were then bilaterally clamped, transected and suture ligated with 0 Vicryl suture.  Two  additional clamps were required to free the  uterus.  Given the girth of the uterus. The uterus was scored out with the Bovie and the fibroid was enucleated from the intramural cavity.  The rest of the uterus, fallopian tubes and ovaries were delivered  without difficulty.  Good hemostasis was noted.  The vaginal cuff was then closed with a 0 Vicryl suture in a running nonlocking fashion, good approximation of edges.  The uterosacral ligaments were plicated centrally and the rest of the vault was closed  with the previously used suture.  A straight catheterization of the bladder yielded 10 mL clear urine for a total of 100 mL urine.  Gloves and gown were changed and attention was directed back up to the abdominal cavity where the patient's abdomen was  insufflated and the pelvic cavity appeared hemostatic.  No active bleeding.  Ureters were identified with normal peristaltic activity bilaterally.  The intraabdominal pressure was lowered to 7 mmHg and good hemostasis was noted and the procedure was  terminated after deflating the patient's abdomen.  All instruments were removed and the port sites were closed with interrupted 4-0 Vicryl suture.  Good cosmetic effect.  The patient did receive 30 mg intravenous Toradol at the end of the procedure.  ESTIMATED BLOOD LOSS:  75 mL.  URINE OUTPUT:  100 mL  INTRAOPERATIVE FLUIDS:  1200 mL.  The patient tolerated the procedure well and was taken to recovery room in good condition.   PUS D: 08/27/2022 12:27:21 pm T: 08/27/2022 2:34:00 pm  JOB: [id number]/ [id number]

## 2022-08-27 NOTE — Transfer of Care (Signed)
Immediate Anesthesia Transfer of Care Note  Patient: Heather Hatfield  Procedure(s) Performed: LAPAROSCOPIC ASSISTED VAGINAL HYSTERECTOMY WITH SALPINGO OOPHORECTOMY (Bilateral: Abdomen)  Patient Location: PACU  Anesthesia Type:General  Level of Consciousness: awake and alert   Airway & Oxygen Therapy: Patient Spontanous Breathing and Patient connected to face mask oxygen  Post-op Assessment: Report given to RN and Post -op Vital signs reviewed and stable  Post vital signs: Reviewed  Last Vitals:  Vitals Value Taken Time  BP    Temp    Pulse 67 08/27/22 1213  Resp    SpO2 100 % 08/27/22 1213  Vitals shown include unvalidated device data.  Last Pain:  Vitals:   08/27/22 0819  TempSrc: Temporal  PainSc: 0-No pain         Complications: No notable events documented.

## 2022-08-27 NOTE — Anesthesia Preprocedure Evaluation (Signed)
Anesthesia Evaluation  Patient identified by MRN, date of birth, ID band Patient awake    Reviewed: Allergy & Precautions, H&P , NPO status , Patient's Chart, lab work & pertinent test results, reviewed documented beta blocker date and time   History of Anesthesia Complications (+) PONV and history of anesthetic complications  Airway Mallampati: II  TM Distance: >3 FB Neck ROM: full    Dental  (+) Dental Advidsory Given, Caps, Teeth Intact   Pulmonary neg pulmonary ROS   Pulmonary exam normal breath sounds clear to auscultation       Cardiovascular Exercise Tolerance: Good negative cardio ROS Normal cardiovascular exam Rhythm:regular Rate:Normal     Neuro/Psych  PSYCHIATRIC DISORDERS  Depression    negative neurological ROS     GI/Hepatic negative GI ROS, Neg liver ROS,,,  Endo/Other  negative endocrine ROS    Renal/GU negative Renal ROS  negative genitourinary   Musculoskeletal   Abdominal   Peds  Hematology negative hematology ROS (+)   Anesthesia Other Findings Past Medical History: No date: Anginal pain (HCC)     Comment:  2015 No date: Chicken pox No date: Deviated nasal septum 07/23/2022: History of 2019 novel coronavirus disease (COVID-19) 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 No date: Left knee injury No date: PONV (postoperative nausea and vomiting) No date: Situational depression No date: Situational stress   Reproductive/Obstetrics negative OB ROS                             Anesthesia Physical Anesthesia Plan  ASA: 2  Anesthesia Plan: General   Post-op Pain Management:    Induction: Intravenous  PONV Risk Score and Plan: 4 or greater and Ondansetron, Dexamethasone, Midazolam and Treatment may vary due to age or medical condition  Airway Management Planned: Oral ETT  Additional Equipment:   Intra-op Plan:   Post-operative Plan: Extubation in OR  Informed Consent: I  have reviewed the patients History and Physical, chart, labs and discussed the procedure including the risks, benefits and alternatives for the proposed anesthesia with the patient or authorized representative who has indicated his/her understanding and acceptance.     Dental Advisory Given  Plan Discussed with: Anesthesiologist, CRNA and Surgeon  Anesthesia Plan Comments:         Anesthesia Quick Evaluation

## 2022-08-27 NOTE — Discharge Instructions (Addendum)

## 2022-08-27 NOTE — Progress Notes (Signed)
Up to bathroom again. Able to void 161ms. 3rd peri pad has moderate amount of bleeding. Less than last 2 pads and blood appears to be darker red. VSS. No c/o pain or discomfort. Per Dr SOuida Sills observe for a little longer to recheck peripad.

## 2022-08-27 NOTE — Brief Op Note (Signed)
08/27/2022  12:03 PM  PATIENT:  Heather Hatfield  61 y.o. female  PRE-OPERATIVE DIAGNOSIS:  Fibroid, pelvic pain  POST-OPERATIVE DIAGNOSIS:  Fibroid, pelvic pain  PROCEDURE:  Procedure(s): LAPAROSCOPIC ASSISTED VAGINAL HYSTERECTOMY WITH SALPINGO OOPHORECTOMY (Bilateral)  SURGEON:  Surgeon(s) and Role:    * Adrielle Polakowski, Gwen Her, MD - Primary    * Will Bonnet, MD - Assisting  PHYSICIAN ASSISTANT: CSt  ASSISTANTS: none   ANESTHESIA:   general  EBL:  5 mL   BLOOD ADMINISTERED:none  DRAINS: none   LOCAL MEDICATIONS USED:  LIDOCAINE   SPECIMEN:  Source of Specimen:  cervix , uterus , large fibroid , bilateral fallopian tubes and ovaries   DISPOSITION OF SPECIMEN:  PATHOLOGY  COUNTS:  YES  TOURNIQUET:  * No tourniquets in log *  DICTATION: .Other Dictation: Dictation Number verbal  PLAN OF CARE: Discharge to home after PACU  PATIENT DISPOSITION:  PACU - hemodynamically stable.   Delay start of Pharmacological VTE agent (>24hrs) due to surgical blood loss or risk of bleeding: not applicable

## 2022-08-27 NOTE — Progress Notes (Signed)
Here for LAVH , BSO for pelvic pain and fibroid . LAbs reviewed , NPO . All questions answered . Proceed

## 2022-08-27 NOTE — Progress Notes (Signed)
Has been up to bathroom without success of voiding. 1st peri pad was saturated with bright red blood, was changed and 2nd pad has a good amount on it. No clots seen. No discomfort. VSS. Paged Dr Ouida Sills.

## 2022-08-27 NOTE — Anesthesia Procedure Notes (Signed)
Procedure Name: Intubation Date/Time: 08/27/2022 9:47 AM  Performed by: Gigi Gin, CRNAPre-anesthesia Checklist: Patient identified, Emergency Drugs available, Suction available, Patient being monitored and Timeout performed Patient Re-evaluated:Patient Re-evaluated prior to induction Oxygen Delivery Method: Circle system utilized Preoxygenation: Pre-oxygenation with 100% oxygen Induction Type: IV induction Ventilation: Mask ventilation without difficulty Laryngoscope Size: Mac and 3 Grade View: Grade I Tube type: Oral Laser Tube: Cuffed inflated with minimal occlusive pressure - saline Tube size: 7.0 mm Number of attempts: 1 Airway Equipment and Method: Stylet Placement Confirmation: ETT inserted through vocal cords under direct vision, positive ETCO2, CO2 detector and breath sounds checked- equal and bilateral Secured at: 21 (rt lip) cm Tube secured with: Tape Dental Injury: Teeth and Oropharynx as per pre-operative assessment  Comments: Pt has exisiting dry scabs on lower lip.  Vaseline gauze applied to lips.

## 2022-08-27 NOTE — Progress Notes (Signed)
Peri pad with minimal bleeding. Has not needed to change a pad in over an hour. Voices readiness to be discharged home. Voiding without difficulty. Advised her if she is saturating more than a pad an hour to go the the ED.

## 2022-08-28 ENCOUNTER — Encounter: Payer: Self-pay | Admitting: Obstetrics and Gynecology

## 2022-08-29 LAB — SURGICAL PATHOLOGY

## 2022-09-01 NOTE — Anesthesia Postprocedure Evaluation (Signed)
Anesthesia Post Note  Patient: Heather Hatfield  Procedure(s) Performed: LAPAROSCOPIC ASSISTED VAGINAL HYSTERECTOMY WITH SALPINGO OOPHORECTOMY (Bilateral: Abdomen)  Patient location during evaluation: PACU Anesthesia Type: General Level of consciousness: awake and alert Pain management: pain level controlled Vital Signs Assessment: post-procedure vital signs reviewed and stable Respiratory status: spontaneous breathing, nonlabored ventilation, respiratory function stable and patient connected to nasal cannula oxygen Cardiovascular status: blood pressure returned to baseline and stable Postop Assessment: no apparent nausea or vomiting Anesthetic complications: no   No notable events documented.   Last Vitals:  Vitals:   08/27/22 1523 08/27/22 1634  BP: (!) 154/84 (!) 162/81  Pulse: (!) 58 73  Resp: 15 15  Temp:    SpO2: 98% 100%    Last Pain:  Vitals:   08/27/22 1634  TempSrc:   PainSc: 0-No pain                 Martha Clan
